# Patient Record
Sex: Female | Born: 1965 | Race: White | Hispanic: No | Smoking: Never smoker
Health system: Southern US, Community
[De-identification: ages and names within clinical notes are randomized; demographics above are authoritative.]

## PROBLEM LIST (undated history)

## (undated) DIAGNOSIS — K279 Peptic ulcer, site unspecified, unspecified as acute or chronic, without hemorrhage or perforation: Secondary | ICD-10-CM

## (undated) DIAGNOSIS — J309 Allergic rhinitis, unspecified: Secondary | ICD-10-CM

## (undated) HISTORY — PX: NO PAST SURGERIES: SHX2092

---

## 2003-10-31 ENCOUNTER — Other Ambulatory Visit: Admission: RE | Admit: 2003-10-31 | Discharge: 2003-10-31 | Payer: Self-pay | Admitting: *Deleted

## 2004-10-31 ENCOUNTER — Other Ambulatory Visit: Admission: RE | Admit: 2004-10-31 | Discharge: 2004-10-31 | Payer: Self-pay | Admitting: *Deleted

## 2006-01-06 ENCOUNTER — Other Ambulatory Visit: Admission: RE | Admit: 2006-01-06 | Discharge: 2006-01-06 | Payer: Self-pay | Admitting: *Deleted

## 2006-01-19 ENCOUNTER — Encounter: Admission: RE | Admit: 2006-01-19 | Discharge: 2006-01-19 | Payer: Self-pay | Admitting: Family Medicine

## 2007-01-12 ENCOUNTER — Other Ambulatory Visit: Admission: RE | Admit: 2007-01-12 | Discharge: 2007-01-12 | Payer: Self-pay | Admitting: *Deleted

## 2007-02-09 ENCOUNTER — Encounter: Admission: RE | Admit: 2007-02-09 | Discharge: 2007-02-09 | Payer: Self-pay | Admitting: Family Medicine

## 2008-02-25 ENCOUNTER — Encounter: Admission: RE | Admit: 2008-02-25 | Discharge: 2008-02-25 | Payer: Self-pay | Admitting: Family Medicine

## 2008-02-29 ENCOUNTER — Other Ambulatory Visit: Admission: RE | Admit: 2008-02-29 | Discharge: 2008-02-29 | Payer: Self-pay | Admitting: Family Medicine

## 2009-03-13 ENCOUNTER — Encounter: Admission: RE | Admit: 2009-03-13 | Discharge: 2009-03-13 | Payer: Self-pay | Admitting: Family Medicine

## 2009-03-29 ENCOUNTER — Other Ambulatory Visit: Admission: RE | Admit: 2009-03-29 | Discharge: 2009-03-29 | Payer: Self-pay | Admitting: Family Medicine

## 2016-11-24 ENCOUNTER — Ambulatory Visit (INDEPENDENT_AMBULATORY_CARE_PROVIDER_SITE_OTHER): Payer: No Typology Code available for payment source | Admitting: Family Medicine

## 2016-11-24 VITALS — BP 119/69 | HR 69 | Temp 98.0°F | Resp 16 | Ht <= 58 in | Wt 97.4 lb

## 2016-11-24 DIAGNOSIS — R0981 Nasal congestion: Secondary | ICD-10-CM | POA: Diagnosis not present

## 2016-11-24 DIAGNOSIS — J00 Acute nasopharyngitis [common cold]: Secondary | ICD-10-CM

## 2016-11-24 MED ORDER — FLUTICASONE PROPIONATE 50 MCG/ACT NA SUSP: 2.0000 | Freq: Every day | NASAL | 6 refills | Status: DC

## 2016-11-24 MED ORDER — CETIRIZINE HCL 10 MG PO TABS: 10.0000 mg | ORAL_TABLET | Freq: Every day | ORAL | 11 refills | Status: DC

## 2016-11-24 NOTE — Patient Instructions (Addendum)
We recommend that you schedule a mammogram for breast cancer screening. Typically, you do not need a referral to do this. Please contact a local imaging center to schedule your mammogram.  [facility] - [phone]  *ask for the Radiology Department [facility]) - [phone] or [phone]  [facility] - [phone] [facility] - [phone] [facility] - [phone]  *ask for the Radiology Department [facility] - [phone]  *ask for the Radiology Department [facility] - [phone]  *ask for the Mammography Department [facility] - [phone] We recommend that you schedule a mammogram for breast cancer screening. Typically, you do not need a referral to do this. Please contact a local imaging center to schedule your mammogram.      IF you received an x-ray today, you will receive an invoice from [facility]. Please contact [facility] at [phone] with questions or concerns regarding your invoice.   IF you received labwork today, you will receive an invoice from [facility]. Please contact [facility] at [phone] with questions or concerns regarding your invoice.   Our billing staff will not be able to assist you with questions regarding bills from these companies.  You will be contacted with the lab results as soon as they are available. The fastest way to get your results is to activate your My Chart account. Instructions are located on the last page of this paperwork. If you have not heard from [place] regarding the results in 2 weeks, please contact this office.     Allergic Rhinitis Allergic rhinitis is when the mucous membranes in the nose respond to allergens. Allergens are particles in the air that cause your body to have an allergic reaction. This causes you to release allergic antibodies. Through a chain of events, these eventually  cause you to release histamine into the blood stream. Although meant to protect the body, it is this release of histamine that causes your discomfort, such as frequent sneezing, congestion, and an itchy, runny nose. What are the causes? Seasonal allergic rhinitis (hay fever) is caused by pollen allergens that may come from grasses, trees, and weeds. Year-round allergic rhinitis (perennial allergic rhinitis) is caused by allergens such as house dust mites, pet dander, and mold spores. What are the signs or symptoms?  Nasal stuffiness (congestion).  Itchy, runny nose with sneezing and tearing of the eyes. How is this diagnosed? Your health care provider can help you determine the allergen or allergens that trigger your symptoms. If you and your health care provider are unable to determine the allergen, skin or blood testing may be used. Your health care provider will diagnose your condition after taking your health history and performing a physical exam. Your health care provider may assess you for other related conditions, such as asthma, pink eye, or an ear infection. How is this treated? Allergic rhinitis does not have a cure, but it can be controlled by:  Medicines that block allergy symptoms. These may include allergy shots, nasal sprays, and oral antihistamines.  Avoiding the allergen. Hay fever may often be treated with antihistamines in pill or nasal spray forms. Antihistamines block the effects of histamine. There are over-the-counter medicines that may help with nasal congestion and swelling around the eyes. Check with your health care provider before taking or giving this medicine. If avoiding the allergen or the medicine prescribed do not work, there are many new medicines your health care  provider can prescribe. Stronger medicine may be used if initial measures are ineffective. Desensitizing injections can be used if medicine and avoidance does not work. Desensitization is when a patient  is given ongoing shots until the body becomes less sensitive to the allergen. Make sure you follow up with your health care provider if problems continue. Follow these instructions at home: It is not possible to completely avoid allergens, but you can reduce your symptoms by taking steps to limit your exposure to them. It helps to know exactly what you are allergic to so that you can avoid your specific triggers. Contact a health care provider if:  You have a fever.  You develop a cough that does not stop easily (persistent).  You have shortness of breath.  You start wheezing.  Symptoms interfere with normal daily activities. This information is not intended to replace advice given to you by your health care provider. Make sure you discuss any questions you have with your health care provider. Document Released: 05/20/2001 Document Revised: 04/25/2016 Document Reviewed: 05/02/2013 Elsevier Interactive Patient Education  2017 [employer]

## 2016-11-24 NOTE — Progress Notes (Addendum)
  Chief Complaint  Patient presents with  . URI    sinus/pressure pain, lt ear pops, dry hacking cough few wks    HPI   Dry hacking cough with popping sensation in her ear She has been coughing for 2 weeks She initially had a more productive cough earlier in the episode She denies wheezing She reports that has sinus pressure She denies fevers or chills No sick contacts Non smoker No asthma history  She denies rashes, chest pains, urinary symptoms or diarrhea   No past medical history on file.  Current Outpatient Prescriptions  Medication Sig Dispense Refill  . cetirizine (ZYRTEC) 10 MG tablet Take 1 tablet (10 mg total) by mouth daily. 30 tablet 11  . fluticasone (FLONASE) 50 MCG/ACT nasal spray Place 2 sprays into both nostrils daily. 16 g 6   No current facility-administered medications for this visit.     Allergies: No Known Allergies  No past surgical history on file.  Social History   Social History  . Marital status: Married    Spouse name: N/A  . Number of children: N/A  . Years of education: N/A   Social History Main Topics  . Smoking status: Never Smoker  . Smokeless tobacco: Never Used  . Alcohol use 1.2 oz/week    2 Cans of beer per week  . Drug use: No  . Sexual activity: Not Asked   Other Topics Concern  . None   Social History Narrative  . None    ROS See hpi  Objective: Vitals:   11/24/16 0837  BP: 119/69  Pulse: 69  Resp: 16  Temp: 98 F (36.7 C)  TempSrc: Oral  SpO2: 97%  Weight: 97 lb 6.4 oz (44.2 kg)  Height: 4\' 10"  (1.473 m)    Physical Exam General: alert, oriented, in NAD Head: normocephalic, atraumatic, + frontal sinus tenderness Eyes: EOM intact, no scleral icterus or conjunctival injection Ears: TM clear bilaterally Nose: mucosa with erythema and edema Throat: no pharyngeal exudate or erythema Lymph: no posterior auricular, submental or cervical lymph adenopathy Heart: normal rate, normal sinus rhythm, no  murmurs Lungs: clear to auscultation bilaterally, no wheezing   Assessment and Plan Paula Ramos was seen today for uri.  Diagnoses and all orders for this visit:  Sinus congestion Nasal congestion Acute infective rhinitis  Discussed viral etiology Discussed otc decongestant Advised flonase for congestion  Increase hydration Vitamin C and zinc lozenges suggested Return to clinic if symptoms worse  -     fluticasone (FLONASE) 50 MCG/ACT nasal spray; Place 2 sprays into both nostrils daily. -     cetirizine (ZYRTEC) 10 MG tablet; Take 1 tablet (10 mg total) by mouth daily.     Paula Ramos A Creta LevinStallings'

## 2018-02-02 DIAGNOSIS — H109 Unspecified conjunctivitis: Secondary | ICD-10-CM | POA: Insufficient documentation

## 2018-02-02 DIAGNOSIS — J309 Allergic rhinitis, unspecified: Secondary | ICD-10-CM | POA: Insufficient documentation

## 2018-02-02 DIAGNOSIS — M542 Cervicalgia: Secondary | ICD-10-CM | POA: Insufficient documentation

## 2018-03-05 ENCOUNTER — Other Ambulatory Visit: Payer: Self-pay | Admitting: Family Medicine

## 2018-03-05 DIAGNOSIS — Z1231 Encounter for screening mammogram for malignant neoplasm of breast: Secondary | ICD-10-CM

## 2018-03-26 ENCOUNTER — Ambulatory Visit: Payer: Self-pay

## 2018-03-29 ENCOUNTER — Encounter: Payer: Self-pay | Admitting: Gastroenterology

## 2018-05-05 ENCOUNTER — Ambulatory Visit (AMBULATORY_SURGERY_CENTER): Payer: Self-pay | Admitting: *Deleted

## 2018-05-05 VITALS — Ht <= 58 in | Wt 95.0 lb

## 2018-05-05 DIAGNOSIS — Z1211 Encounter for screening for malignant neoplasm of colon: Secondary | ICD-10-CM

## 2018-05-05 NOTE — Progress Notes (Signed)
Patient denies any allergies to eggs or soy. Patient denies any problems with anesthesia/sedation. Patient denies any oxygen use at home. Patient denies taking any diet/weight loss medications or blood thinners. EMMI education assisgned to patient on colonoscopy, this was explained and instructions given to patient. 

## 2018-05-20 ENCOUNTER — Encounter: Payer: Self-pay | Admitting: Gastroenterology

## 2018-06-07 ENCOUNTER — Encounter (HOSPITAL_BASED_OUTPATIENT_CLINIC_OR_DEPARTMENT_OTHER): Payer: Self-pay | Admitting: *Deleted

## 2018-06-07 ENCOUNTER — Other Ambulatory Visit: Payer: Self-pay

## 2018-06-07 ENCOUNTER — Emergency Department (HOSPITAL_BASED_OUTPATIENT_CLINIC_OR_DEPARTMENT_OTHER)
Admission: EM | Admit: 2018-06-07 | Discharge: 2018-06-08 | Disposition: A | Discharge status: Home / Self Care | Payer: No Typology Code available for payment source | Attending: Emergency Medicine | Admitting: Emergency Medicine

## 2018-06-07 DIAGNOSIS — Z79899 Other long term (current) drug therapy: Secondary | ICD-10-CM | POA: Insufficient documentation

## 2018-06-07 DIAGNOSIS — R1013 Epigastric pain: Secondary | ICD-10-CM | POA: Insufficient documentation

## 2018-06-07 LAB — URINALYSIS, MICROSCOPIC (REFLEX): WBC, UA: >50 WBC/hpf (ref 0–5)

## 2018-06-07 LAB — COMPREHENSIVE METABOLIC PANEL
ALT: 14 U/L (ref 0–44)
AST: 19 U/L (ref 15–41)
Albumin: 4.5 g/dL (ref 3.5–5.0)
Alkaline Phosphatase: 65 U/L (ref 38–126)
Anion gap: 11 (ref 5–15)
BUN: 32 mg/dL — ABNORMAL HIGH (ref 6–20)
CO2: 26 mmol/L (ref 22–32)
Calcium: 9.3 mg/dL (ref 8.9–10.3)
Chloride: 102 mmol/L (ref 98–111)
Creatinine, Ser: 0.95 mg/dL (ref 0.44–1.00)
GFR calc Af Amer: >60 mL/min (ref >60)
GFR calc non Af Amer: >60 mL/min (ref >60)
Glucose, Bld: 107 mg/dL — ABNORMAL HIGH (ref 70–99)
Potassium: 3.7 mmol/L (ref 3.5–5.1)
Sodium: 139 mmol/L (ref 135–145)
Total Bilirubin: 1 mg/dL (ref 0.3–1.2)
Total Protein: 8 g/dL (ref 6.5–8.1)

## 2018-06-07 LAB — CBC
HCT: 34.1 % — ABNORMAL LOW (ref 36.0–46.0)
Hemoglobin: 11.4 g/dL — ABNORMAL LOW (ref 12.0–15.0)
MCH: 32.7 pg (ref 26.0–34.0)
MCHC: 33.4 g/dL (ref 30.0–36.0)
MCV: 97.7 fL (ref 78.0–100.0)
Platelets: 247 10*3/uL (ref 150–400)
RBC: 3.49 MIL/uL — ABNORMAL LOW (ref 3.87–5.11)
RDW: 11.7 % (ref 11.5–15.5)
WBC: 12.8 10*3/uL — ABNORMAL HIGH (ref 4.0–10.5)

## 2018-06-07 LAB — URINALYSIS, ROUTINE W REFLEX MICROSCOPIC
Bilirubin Urine: NEGATIVE
Glucose, UA: NEGATIVE mg/dL
Ketones, ur: 15 mg/dL — AB
Nitrite: NEGATIVE
Protein, ur: NEGATIVE mg/dL
Specific Gravity, Urine: 1.01 (ref 1.005–1.030)
pH: 6.5 (ref 5.0–8.0)

## 2018-06-07 LAB — LIPASE, BLOOD: Lipase: 40 U/L (ref 11–51)

## 2018-06-07 MED ORDER — SODIUM CHLORIDE 0.9 % IV BOLUS
1000.0000 mL | Freq: Once | INTRAVENOUS | Status: AC
  Administered 2018-06-07: 1000 mL via INTRAVENOUS

## 2018-06-07 MED ORDER — GI COCKTAIL ~~LOC~~
30.0000 mL | Freq: Once | ORAL | Status: AC
Start: 2018-06-07 — End: 2018-06-07
  Administered 2018-06-07: 30 mL via ORAL
  Filled 2018-06-07: qty 30

## 2018-06-07 MED ORDER — FAMOTIDINE 20 MG PO TABS
20.0000 mg | ORAL_TABLET | Freq: Once | ORAL | Status: AC
  Administered 2018-06-07: 20 mg via ORAL
  Filled 2018-06-07: qty 1

## 2018-06-07 MED ORDER — PANTOPRAZOLE SODIUM 20 MG PO TBEC: 20.0000 mg | DELAYED_RELEASE_TABLET | Freq: Two times a day (BID) | ORAL | 0 refills | Status: DC

## 2018-06-07 NOTE — ED Provider Notes (Signed)
[facility] EMERGENCY DEPARTMENT Provider Note   CSN: [id number] Arrival date & time: 06/07/18  1902     History   Chief Complaint Chief Complaint  Patient presents with  . Abdominal Pain    HPI Paula Ramos is a 52 y.o. female.  HPI    52 year old female with upper abdominal pain.  She has had nausea.  Onset 3 days ago.  The vomiting.  No diarrhea.  No urinary complaints.  Symptoms fairly constant.  No appreciable exacerbating relieving factors. History reviewed. No pertinent past medical history.  Patient Active Problem List   Diagnosis Date Noted  . Allergic rhinitis 02/02/2018  . Conjunctivitis 02/02/2018  . Neck pain 02/02/2018    Past Surgical History:  Procedure Laterality Date  . NO PAST SURGERIES       OB History   None      Home Medications    Prior to Admission medications   Medication Sig Start Date End Date Taking? Authorizing Provider  bisacodyl (BISACODYL) 5 MG EC tablet Take 5 mg by mouth once.    [provider]  cetirizine (ZYRTEC) 10 MG tablet Take 1 tablet (10 mg total) by mouth daily. 11/24/16   Doristine Bosworth, MD  fluticasone (FLONASE) 50 MCG/ACT nasal spray Place 2 sprays into both nostrils daily. 11/24/16   Doristine Bosworth, MD  ibuprofen (ADVIL,MOTRIN) 200 MG tablet Take 2 tablets by mouth as needed.    [provider]  Multiple Vitamin (MULTI-VITAMIN DAILY PO) Take 1 tablet by mouth daily.    [provider]    Family History Family History  Problem Relation Age of Onset  . Kidney disease Mother   . Colon cancer Neg Hx   . Esophageal cancer Neg Hx   . Rectal cancer Neg Hx   . Stomach cancer Neg Hx     Social History Social History   Tobacco Use  . Smoking status: Never Smoker  . Smokeless tobacco: Never Used  Substance Use Topics  . Alcohol use: Yes    Alcohol/week: 2.0 standard drinks    Types: 2 Cans of beer per week  . Drug use: No     Allergies   Patient has no known  allergies.   Review of Systems Review of Systems  All systems reviewed and negative, other than as noted in HPI.  Physical Exam Updated Vital Signs BP 133/67   Pulse 85   Temp 99.3 F (37.4 C)   Resp (!) 24   Ht 4\' 10"  (1.473 m)   Wt 42.6 kg   SpO2 98%   BMI 19.65 kg/m   Physical Exam  Constitutional: She appears well-developed and well-nourished. No distress.  HENT:  Head: Normocephalic and atraumatic.  Eyes: Conjunctivae are normal. Right eye exhibits no discharge. Left eye exhibits no discharge.  Neck: Neck supple.  Cardiovascular: Normal rate, regular rhythm and normal heart sounds. Exam reveals no gallop and no friction rub.  No murmur heard. Pulmonary/Chest: Effort normal and breath sounds normal. No respiratory distress.  Abdominal: Soft. She exhibits no distension. There is tenderness in the epigastric area. There is no rigidity, no rebound and no guarding.  Musculoskeletal: She exhibits no edema or tenderness.  Neurological: She is alert.  Skin: Skin is warm and dry.  Psychiatric: She has a normal mood and affect. Her behavior is normal. Thought content normal.  Nursing note and vitals reviewed.    ED Treatments / Results  Labs (all labs ordered are listed, but  only abnormal results are displayed) Labs Reviewed  URINALYSIS, ROUTINE W REFLEX MICROSCOPIC - Abnormal; Notable for the following components:      Result Value   Hgb urine dipstick TRACE (*)    Ketones, ur 15 (*)    Leukocytes, UA LARGE (*)    All other components within normal limits  URINALYSIS, MICROSCOPIC (REFLEX) - Abnormal; Notable for the following components:   Bacteria, UA FEW (*)    All other components within normal limits  LIPASE, BLOOD  COMPREHENSIVE METABOLIC PANEL  CBC    EKG None  Radiology No results found.  Procedures Procedures (including critical care time)  Medications Ordered in ED Medications - No data to display   Initial Impression / Assessment and Plan /  ED Course  I have reviewed the triage vital signs and the nursing notes.  Pertinent labs & imaging results that were available during my care of the patient were reviewed by me and considered in my medical decision making (see chart for details).    52 year old female with upper abdominal pain.  Suspect possible PUD or gastritis.  ED work-up fairly unremarkable.  Treated symptomatically with much improvement.  I doubt acute surgical emergency.  Return precautions discussed.  Final Clinical Impressions(s) / ED Diagnoses   Final diagnoses:  Epigastric pain    ED Discharge Orders         Ordered    pantoprazole (PROTONIX) 20 MG tablet  2 times daily before meals     06/07/18 2313           Raeford Razor, MD 06/16/18 1552

## 2018-06-07 NOTE — ED Notes (Signed)
Pt was seen by [facility] today and referred to GI for recurrent abd pain.

## 2018-06-07 NOTE — ED Triage Notes (Signed)
Pt c/o abd pain x 3 days denies v/d

## 2018-06-07 NOTE — ED Notes (Signed)
ED Provider at bedside. 

## 2018-06-07 NOTE — ED Notes (Signed)
Pt with intermittent abd pain and nausea for 3 days. Denies diarrhea.

## 2018-06-09 LAB — URINE CULTURE

## 2018-07-06 ENCOUNTER — Ambulatory Visit (AMBULATORY_SURGERY_CENTER): Payer: Self-pay | Admitting: *Deleted

## 2018-07-06 ENCOUNTER — Encounter: Payer: Self-pay | Admitting: Gastroenterology

## 2018-07-06 VITALS — Ht <= 58 in | Wt 95.0 lb

## 2018-07-06 DIAGNOSIS — Z1211 Encounter for screening for malignant neoplasm of colon: Secondary | ICD-10-CM

## 2018-07-06 NOTE — Progress Notes (Signed)
Patient denies any allergies to eggs or soy. Patient denies any problems with anesthesia/sedation. Patient denies any oxygen use at home. Patient denies taking any diet/weight loss medications or blood thinners. EMMI education already watched by the pt.

## 2018-07-20 ENCOUNTER — Ambulatory Visit (AMBULATORY_SURGERY_CENTER): Payer: No Typology Code available for payment source | Admitting: Gastroenterology

## 2018-07-20 ENCOUNTER — Encounter: Payer: Self-pay | Admitting: Gastroenterology

## 2018-07-20 VITALS — BP 153/62 | HR 69 | Temp 98.0°F | Resp 17 | Ht <= 58 in | Wt 97.0 lb

## 2018-07-20 DIAGNOSIS — Z1211 Encounter for screening for malignant neoplasm of colon: Secondary | ICD-10-CM | POA: Diagnosis not present

## 2018-07-20 MED ORDER — SODIUM CHLORIDE 0.9 % IV SOLN: 500.0000 mL | Freq: Once | INTRAVENOUS | Status: DC

## 2018-07-20 NOTE — Patient Instructions (Signed)
*  handout on hemorrhoids given*  YOU HAD AN ENDOSCOPIC PROCEDURE TODAY AT THE [facility]:   Refer to the procedure report that was given to you for any specific questions about what was found during the examination.  If the procedure report does not answer your questions, please call your gastroenterologist to clarify.  If you requested that your care partner not be given the details of your procedure findings, then the procedure report has been included in a sealed envelope for you to review at your convenience later.  YOU SHOULD EXPECT: Some feelings of bloating in the abdomen. Passage of more gas than usual.  Walking can help get rid of the air that was put into your GI tract during the procedure and reduce the bloating. If you had a lower endoscopy (such as a colonoscopy or flexible sigmoidoscopy) you may notice spotting of blood in your stool or on the toilet paper. If you underwent a bowel prep for your procedure, you may not have a normal bowel movement for a few days.  Please Note:  You might notice some irritation and congestion in your nose or some drainage.  This is from the oxygen used during your procedure.  There is no need for concern and it should clear up in a day or so.  SYMPTOMS TO REPORT IMMEDIATELY:   Following lower endoscopy (colonoscopy or flexible sigmoidoscopy):  Excessive amounts of blood in the stool  Significant tenderness or worsening of abdominal pains  Swelling of the abdomen that is new, acute  Fever of 100F or higher   For urgent or emergent issues, a gastroenterologist can be reached at any hour by calling [phone].   DIET:  We do recommend a small meal at first, but then you may proceed to your regular diet.  Drink plenty of fluids but you should avoid alcoholic beverages for 24 hours.  ACTIVITY:  You should plan to take it easy for the rest of today and you should NOT DRIVE or use heavy machinery until tomorrow (because of the sedation  medicines used during the test).    FOLLOW UP: Our staff will call the number listed on your records the next business day following your procedure to check on you and address any questions or concerns that you may have regarding the information given to you following your procedure. If we do not reach you, we will leave a message.  However, if you are feeling well and you are not experiencing any problems, there is no need to return our call.  We will assume that you have returned to your regular daily activities without incident.  If any biopsies were taken you will be contacted by phone or by letter within the next 1-3 weeks.  Please call us at [phone] if you have not heard about the biopsies in 3 weeks.    SIGNATURES/CONFIDENTIALITY: You and/or your care partner have signed paperwork which will be entered into your electronic medical record.  These signatures attest to the fact that that the information above on your After Visit Summary has been reviewed and is understood.  Full responsibility of the confidentiality of this discharge information lies with you and/or your care-partner. 

## 2018-07-20 NOTE — Progress Notes (Signed)
Report to RN, VSS, adequate respirations noted, no c/o pain or discomfort 

## 2018-07-20 NOTE — Op Note (Signed)
[facility] Patient Name: Paula Ramos Procedure Date: 07/20/2018 9:25 AM MRN: [id number] Endoscopist: Justice Britain , MD Age: 52 Referring MD:  Date of Birth: May 08, 1966 Gender: Female Account #: [id number] Procedure:                Colonoscopy Indications:              Screening for colorectal malignant neoplasm Medicines:                Monitored Anesthesia Care Procedure:                Pre-Anesthesia Assessment:                           - Prior to the procedure, a History and Physical                            was performed, and patient medications and                            allergies were reviewed. The patient's tolerance of                            previous anesthesia was also reviewed. The risks                            and benefits of the procedure and the sedation                            options and risks were discussed with the patient.                            All questions were answered, and informed consent                            was obtained. Prior Anticoagulants: The patient has                            taken no previous anticoagulant or antiplatelet                            agents. ASA Grade Assessment: II - A patient with                            mild systemic disease. After reviewing the risks                            and benefits, the patient was deemed in                            satisfactory condition to undergo the procedure.                           After obtaining informed consent, the colonoscope  was passed under direct vision. Throughout the                            procedure, the patient's blood pressure, pulse, and                            oxygen saturations were monitored continuously. The                            Model PCF-[id number]) scope was introduced                            through the anus and advanced to the 5 cm into the                            ileum. The  colonoscopy was performed without                            difficulty. The patient tolerated the procedure.                            The quality of the bowel preparation was evaluated                            using the BBPS [facility] Bowel Preparation Scale)                            with scores of: Right Colon = 3, Transverse Colon =                            3 and Left Colon = 3 (entire mucosa seen well with                            no residual staining, small fragments of stool or                            opaque liquid). The total BBPS score equals 9. Scope In: 9:28:09 AM Scope Out: 9:41:40 AM Scope Withdrawal Time: 0 hours 7 minutes 14 seconds  Total Procedure Duration: 0 hours 13 minutes 31 seconds  Findings:                 Skin tags were found on perianal exam.                           The digital rectal exam findings include                            non-thrombosed internal hemorrhoids. Pertinent                            negatives include no palpable rectal lesions.  The colon (entire examined portion) was moderately                            tortuous. Advancing the scope required changing the                            patient's position, using manual pressure,                            straightening and shortening the scope to obtain                            bowel loop reduction and using scope torsion.                           The terminal ileum and ileocecal valve appeared                            normal.                           Normal mucosa was found in the entire colon.                           Anal papilla(e) were hypertrophied.                           Non-bleeding non-thrombosed internal hemorrhoids                            were found during retroflexion, during perianal                            exam and during digital exam. The hemorrhoids were                            Grade II (internal hemorrhoids that prolapse  but                            reduce spontaneously). Complications:            No immediate complications. Estimated Blood Loss:     Estimated blood loss: none. Impression:               - Perianal skin tags found on perianal exam.                           - Non-thrombosed internal hemorrhoids found on                            digital rectal exam.                           - Tortuous colon.                           - The examined portion of the  ileum was normal.                           - Normal mucosa in the entire examined colon.                           - Anal papilla(e) were hypertrophied.                           - Non-bleeding non-thrombosed internal hemorrhoids. Recommendation:           - The patient will be observed post-procedure,                            until all discharge criteria are met.                           - Discharge patient to home.                           - Patient has a contact number available for                            emergencies. The signs and symptoms of potential                            delayed complications were discussed with the                            patient. Return to normal activities tomorrow.                            Written discharge instructions were provided to the                            patient.                           - Resume previous diet.                           - Continue present medications.                           - Consider addition of fiber supplementation 1-2                            times daily (Fibercon/Metamucil/Citrucel/Benefiber).                           - Would consider Miralax use once daily to help if                            issues of constipation occur.                           - Repeat colonoscopy in 10 years for  screening                            purposes.                           - The findings and recommendations were discussed                            with the patient.                            - The findings and recommendations were discussed                            with the designated responsible adult. Justice Britain, MD 07/20/2018 9:49:08 AM

## 2018-07-20 NOTE — Progress Notes (Signed)
Pt's states no medical or surgical changes since previsit or office visit. 

## 2018-07-21 ENCOUNTER — Telehealth: Payer: Self-pay

## 2018-07-21 NOTE — Telephone Encounter (Signed)
  Follow up Call-  Call back number 07/20/2018  Post procedure Call Back phone  # [phone] cell  Permission to leave phone message Yes  Some recent data might be hidden     Patient questions:  Do you have a fever, pain , or abdominal swelling? No. Pain Score  0 *  Have you tolerated food without any problems? Yes.    Have you been able to return to your normal activities? Yes.    Do you have any questions about your discharge instructions: Diet   No. Medications  No. Follow up visit  No.  Do you have questions or concerns about your Care? No.  Actions: * If pain score is 4 or above: No action needed, pain <4.

## 2021-04-22 ENCOUNTER — Encounter: Payer: Self-pay | Admitting: Emergency Medicine

## 2021-04-22 ENCOUNTER — Ambulatory Visit
Admission: EM | Admit: 2021-04-22 | Discharge: 2021-04-22 | Disposition: A | Discharge status: Home / Self Care | Payer: 59 | Attending: Emergency Medicine | Admitting: Emergency Medicine

## 2021-04-22 ENCOUNTER — Other Ambulatory Visit: Payer: Self-pay

## 2021-04-22 DIAGNOSIS — Z1152 Encounter for screening for COVID-19: Secondary | ICD-10-CM

## 2021-04-22 DIAGNOSIS — R5383 Other fatigue: Secondary | ICD-10-CM | POA: Diagnosis not present

## 2021-04-22 DIAGNOSIS — R0981 Nasal congestion: Secondary | ICD-10-CM | POA: Diagnosis not present

## 2021-04-22 DIAGNOSIS — R109 Unspecified abdominal pain: Secondary | ICD-10-CM

## 2021-04-22 LAB — POCT URINALYSIS DIP (MANUAL ENTRY)
Bilirubin, UA: NEGATIVE
Blood, UA: NEGATIVE
Glucose, UA: NEGATIVE mg/dL
Ketones, POC UA: NEGATIVE mg/dL
Leukocytes, UA: NEGATIVE
Nitrite, UA: NEGATIVE
Spec Grav, UA: 1.02 (ref 1.010–1.025)
Urobilinogen, UA: 0.2 E.U./dL
pH, UA: 7 (ref 5.0–8.0)

## 2021-04-22 MED ORDER — TRIAMCINOLONE ACETONIDE 55 MCG/ACT NA AERO: 2.0000 | INHALATION_SPRAY | Freq: Every day | NASAL | 12 refills | Status: DC

## 2021-04-22 MED ORDER — PREDNISONE 20 MG PO TABS: 40.0000 mg | ORAL_TABLET | Freq: Every day | ORAL | 0 refills | Status: AC

## 2021-04-22 MED ORDER — DOXYCYCLINE HYCLATE 100 MG PO CAPS
100.0000 mg | ORAL_CAPSULE | Freq: Two times a day (BID) | ORAL | 0 refills | Status: AC
Start: 2021-04-22 — End: 2021-04-29

## 2021-04-22 MED ORDER — TIZANIDINE HCL 2 MG PO TABS: 2.0000 mg | ORAL_TABLET | Freq: Four times a day (QID) | ORAL | 0 refills | Status: DC | PRN

## 2021-04-22 NOTE — Discharge Instructions (Addendum)
Blood work and Occupational psychologist daily Nasacort nasal spray Begin doxycycline twice daily for 1 week to treat possible sinus infection Prednisone 40 mg daily x5 days-take with food and earlier in the day if possible-this is meant to help with sinus inflammation/congestion along with any inflammation within muscles of back Supplement with tizanidine at home or bedtime-this is a muscle relaxer, do not drive or work after taking Follow-up with primary care

## 2021-04-22 NOTE — ED Triage Notes (Addendum)
Patient c/o LFT sided flank pain x 4 months.   Patient denies fever at home.   Patient states " this has been going on for a while but would go away and then come back".  Patient denies dysuria.   Patient endorses "dark colored" urine.   Patient states " I haven't used the restroom in 24 hours, like potty" as in bowel movement.   Patient has increased fluid intake.    Patient c/o nasal congestion x "months".   Patient states symptoms "are off and on".   Patient endorses headache at times.   Patient has used Advil w/ no relief of symptoms.

## 2021-04-22 NOTE — ED Provider Notes (Signed)
[facility]    CSN: [id number] Arrival date & time: 04/22/21  1108      History   Chief Complaint Chief Complaint  Patient presents with   Flank Pain   Nasal Congestion    HPI Paula Ramos is a 55 y.o. female presenting today for evaluation of left flank pain and nasal congestion.  Reports that she has had left hip pain for approximately 4 months.  Denies any known fevers at home.  Reports symptoms are intermittent.  Denies associated dysuria, but has had dark urine.  Describes an intermittent pain sensation as well as itching going up into back and extremities.  Does express concern regarding her kidneys as she reports her mom was on dialysis.  Associated fatigue x months.   Also reports nasal congestion times months.  Symptoms are also intermittent.  Using ibuprofen without relief.  Reports previous course of Augmentin without relief of symptoms.  Also has tried Flonase and Zyrtec without relief.  She reports persistent postnasal drainage.  Mild headache and feeling warm today, but no known fevers.  No known COVID exposure  HPI  History reviewed. No pertinent past medical history.  Patient Active Problem List   Diagnosis Date Noted   Allergic rhinitis 02/02/2018   Conjunctivitis 02/02/2018   Neck pain 02/02/2018    Past Surgical History:  Procedure Laterality Date   NO PAST SURGERIES      OB History   No obstetric history on file.      Home Medications    Prior to Admission medications   Medication Sig Start Date End Date Taking? Authorizing Provider  doxycycline (VIBRAMYCIN) 100 MG capsule Take 1 capsule (100 mg total) by mouth 2 (two) times daily for 7 days. 04/22/21 04/29/21 Yes Gunner Iodice C, PA-C  ibuprofen (ADVIL,MOTRIN) 200 MG tablet Take 2 tablets by mouth as needed.   Yes [provider]  Multiple Vitamin (MULTI-VITAMIN DAILY PO) Take 1 tablet by mouth daily.   Yes [provider]  predniSONE (DELTASONE) 20 MG tablet Take 2  tablets (40 mg total) by mouth daily with breakfast for 5 days. 04/22/21 04/27/21 Yes Fong Mccarry C, PA-C  tiZANidine (ZANAFLEX) 2 MG tablet Take 1-2 tablets (2-4 mg total) by mouth every 6 (six) hours as needed for muscle spasms. 04/22/21  Yes Jodi Kappes C, PA-C  triamcinolone (NASACORT) 55 MCG/ACT AERO nasal inhaler Place 2 sprays into the nose daily. 04/22/21  Yes Akylah Hascall C, PA-C  bisacodyl (DULCOLAX) 5 MG EC tablet Take 5 mg by mouth once.    [provider]  cetirizine (ZYRTEC) 10 MG tablet Take 1 tablet (10 mg total) by mouth daily. Patient not taking: No sig reported 11/24/16   Doristine Bosworth, MD    Family History Family History  Problem Relation Age of Onset   Kidney disease Mother    Colon cancer Neg Hx    Esophageal cancer Neg Hx    Rectal cancer Neg Hx    Stomach cancer Neg Hx     Social History Social History   Tobacco Use   Smoking status: Never   Smokeless tobacco: Never  Vaping Use   Vaping Use: Never used  Substance Use Topics   Alcohol use: Yes    Alcohol/week: 2.0 standard drinks    Types: 2 Cans of beer per week   Drug use: No     Allergies   Patient has no known allergies.   Review of Systems Review of Systems  Constitutional:  Positive for fatigue. Negative for activity change, appetite change, chills and fever.  HENT:  Positive for congestion and rhinorrhea. Negative for ear pain, sinus pressure, sore throat and trouble swallowing.   Eyes:  Negative for discharge and redness.  Respiratory:  Negative for cough, chest tightness and shortness of breath.   Cardiovascular:  Negative for chest pain.  Gastrointestinal:  Negative for abdominal pain, diarrhea, nausea and vomiting.  Genitourinary:  Positive for flank pain. Negative for dysuria, genital sores, hematuria, menstrual problem, vaginal bleeding, vaginal discharge and vaginal pain.  Musculoskeletal:  Negative for back pain and myalgias.  Skin:  Negative for rash.   Neurological:  Positive for headaches. Negative for dizziness and light-headedness.    Physical Exam Triage Vital Signs ED Triage Vitals  Enc Vitals Group     BP 04/22/21 1119 133/77     Pulse Rate 04/22/21 1119 75     Resp 04/22/21 1119 17     Temp 04/22/21 1119 100.1 F (37.8 C)     Temp Source 04/22/21 1119 Oral     SpO2 04/22/21 1119 98 %     Weight --      Height --      Head Circumference --      Peak Flow --      Pain Score 04/22/21 1117 8     Pain Loc --      Pain Edu? --      Excl. in GC? --    No data found.  Updated Vital Signs BP 133/77 (BP Location: Left Arm)   Pulse 75   Temp 100.1 F (37.8 C) (Oral)   Resp 17   SpO2 98%   Visual Acuity Right Eye Distance:   Left Eye Distance:   Bilateral Distance:    Right Eye Near:   Left Eye Near:    Bilateral Near:     Physical Exam Vitals and nursing note reviewed.  Constitutional:      Appearance: She is well-developed.     Comments: No acute distress  HENT:     Head: Normocephalic and atraumatic.     Ears:     Comments: Bilateral ears without tenderness to palpation of external auricle, tragus and mastoid, EAC's without erythema or swelling, TM's with good bony landmarks and cone of light. Non erythematous.      Nose: Nose normal.     Mouth/Throat:     Comments: Oral mucosa pink and moist, no tonsillar enlargement or exudate. Posterior pharynx patent and nonerythematous, no uvula deviation or swelling. Normal phonation.  Eyes:     Conjunctiva/sclera: Conjunctivae normal.  Cardiovascular:     Rate and Rhythm: Normal rate.  Pulmonary:     Effort: Pulmonary effort is normal. No respiratory distress.     Comments: Breathing comfortably at rest, CTABL, no wheezing, rales or other adventitious sounds auscultated  Abdominal:     General: There is no distension.     Comments: Soft, nondistended, nontender to light and deep Palpation throughout abdomen  Musculoskeletal:        General: Normal range of  motion.     Cervical back: Neck supple.     Comments: Back: Nontender to palpation of cervical, thoracic and lumbar spine midline, mild tenderness to palpation to far lateral lower thoracic musculature, full active range of motion of upper and lower extremities  Skin:    General: Skin is warm and dry.  Neurological:     Mental Status: She is alert and oriented to  person, place, and time.     UC Treatments / Results  Labs (all labs ordered are listed, but only abnormal results are displayed) Labs Reviewed  POCT URINALYSIS DIP (MANUAL ENTRY) - Abnormal; Notable for the following components:      Result Value   Clarity, UA hazy (*)    Protein Ur, POC trace (*)    All other components within normal limits  NOVEL CORONAVIRUS, NAA  CBC WITH DIFFERENTIAL/PLATELET  COMPREHENSIVE METABOLIC PANEL    EKG   Radiology No results found.  Procedures Procedures (including critical care time)  Medications Ordered in UC Medications - No data to display  Initial Impression / Assessment and Plan / UC Course  I have reviewed the triage vital signs and the nursing notes.  Pertinent labs & imaging results that were available during my care of the patient were reviewed by me and considered in my medical decision making (see chart for details).     Nasal congestion times months-treating for possible sinusitis with doxycycline, prednisone course and Nasacort as alternative treatment Fatigue/flank pain-checking blood work to check basic labs, kidney function, monitor for improvement with prednisone use  Establish care with PCP for flareup of follow-up of fatigue if symptoms persistent  Discussed strict return precautions. Patient verbalized understanding and is agreeable with plan.  Final Clinical Impressions(s) / UC Diagnoses   Final diagnoses:  Fatigue, unspecified type  Left flank pain  Encounter for screening for COVID-19  Nasal congestion     Discharge Instructions      Blood  work and COVID test pending Begin daily Nasacort nasal spray Begin doxycycline twice daily for 1 week to treat possible sinus infection Prednisone 40 mg daily x5 days-take with food and earlier in the day if possible-this is meant to help with sinus inflammation/congestion along with any inflammation within muscles of back Supplement with tizanidine at home or bedtime-this is a muscle relaxer, do not drive or work after taking Follow-up with primary care     ED Prescriptions     Medication Sig Dispense Auth. Provider   doxycycline (VIBRAMYCIN) 100 MG capsule Take 1 capsule (100 mg total) by mouth 2 (two) times daily for 7 days. 14 capsule Mishell Donalson C, PA-C   predniSONE (DELTASONE) 20 MG tablet Take 2 tablets (40 mg total) by mouth daily with breakfast for 5 days. 10 tablet Waylynn Benefiel C, PA-C   tiZANidine (ZANAFLEX) 2 MG tablet Take 1-2 tablets (2-4 mg total) by mouth every 6 (six) hours as needed for muscle spasms. 30 tablet Paz Winsett C, PA-C   triamcinolone (NASACORT) 55 MCG/ACT AERO nasal inhaler Place 2 sprays into the nose daily. 1 each Peony Barner, Junius Creamer, PA-C      PDMP not reviewed this encounter.   Lew Dawes, PA-C 04/22/21 1304

## 2021-04-23 LAB — CBC WITH DIFFERENTIAL/PLATELET
Basophils Absolute: 0.1 10*3/uL (ref 0.0–0.2)
Basos: 1 %
EOS (ABSOLUTE): 0 10*3/uL (ref 0.0–0.4)
Eos: 0 %
Hematocrit: 34 % (ref 34.0–46.6)
Hemoglobin: 11.4 g/dL (ref 11.1–15.9)
Immature Grans (Abs): 0 10*3/uL (ref 0.0–0.1)
Immature Granulocytes: 0 %
Lymphocytes Absolute: 2.1 10*3/uL (ref 0.7–3.1)
Lymphs: 21 %
MCH: 33.2 pg — ABNORMAL HIGH (ref 26.6–33.0)
MCHC: 33.5 g/dL (ref 31.5–35.7)
MCV: 99 fL — ABNORMAL HIGH (ref 79–97)
Monocytes Absolute: 0.6 10*3/uL (ref 0.1–0.9)
Monocytes: 6 %
Neutrophils Absolute: 7.5 10*3/uL — ABNORMAL HIGH (ref 1.4–7.0)
Neutrophils: 72 %
Platelets: 231 10*3/uL (ref 150–450)
RBC: 3.43 x10E6/uL — ABNORMAL LOW (ref 3.77–5.28)
RDW: 11.2 % — ABNORMAL LOW (ref 11.7–15.4)
WBC: 10.4 10*3/uL (ref 3.4–10.8)

## 2021-04-23 LAB — NOVEL CORONAVIRUS, NAA: SARS-CoV-2, NAA: NOT DETECTED

## 2021-04-23 LAB — COMPREHENSIVE METABOLIC PANEL
ALT: 14 IU/L (ref 0–32)
AST: 22 IU/L (ref 0–40)
Albumin/Globulin Ratio: 2.3 — ABNORMAL HIGH (ref 1.2–2.2)
Albumin: 5.1 g/dL — ABNORMAL HIGH (ref 3.8–4.9)
Alkaline Phosphatase: 80 IU/L (ref 44–121)
BUN/Creatinine Ratio: 18 (ref 9–23)
BUN: 15 mg/dL (ref 6–24)
Bilirubin Total: 0.3 mg/dL (ref 0.0–1.2)
CO2: 27 mmol/L (ref 20–29)
Calcium: 10.1 mg/dL (ref 8.7–10.2)
Chloride: 104 mmol/L (ref 96–106)
Creatinine, Ser: 0.83 mg/dL (ref 0.57–1.00)
Globulin, Total: 2.2 g/dL (ref 1.5–4.5)
Glucose: 99 mg/dL (ref 65–99)
Potassium: 4.1 mmol/L (ref 3.5–5.2)
Sodium: 145 mmol/L — ABNORMAL HIGH (ref 134–144)
Total Protein: 7.3 g/dL (ref 6.0–8.5)
eGFR: 82 mL/min/{1.73_m2} (ref >59)

## 2021-04-23 LAB — SARS-COV-2, NAA 2 DAY TAT

## 2021-11-25 ENCOUNTER — Other Ambulatory Visit: Payer: Self-pay | Admitting: Family Medicine

## 2021-11-25 DIAGNOSIS — Z1231 Encounter for screening mammogram for malignant neoplasm of breast: Secondary | ICD-10-CM

## 2021-11-29 ENCOUNTER — Ambulatory Visit
Admission: RE | Admit: 2021-11-29 | Discharge: 2021-11-29 | Disposition: A | Discharge status: Home / Self Care | Payer: 59 | Source: Ambulatory Visit | Attending: Family Medicine | Admitting: Family Medicine

## 2021-11-29 ENCOUNTER — Other Ambulatory Visit: Payer: Self-pay

## 2021-11-29 DIAGNOSIS — Z1231 Encounter for screening mammogram for malignant neoplasm of breast: Secondary | ICD-10-CM

## 2021-12-19 ENCOUNTER — Other Ambulatory Visit: Payer: Self-pay | Admitting: Family Medicine

## 2021-12-19 DIAGNOSIS — R3121 Asymptomatic microscopic hematuria: Secondary | ICD-10-CM

## 2021-12-19 DIAGNOSIS — Z841 Family history of disorders of kidney and ureter: Secondary | ICD-10-CM

## 2021-12-20 ENCOUNTER — Ambulatory Visit
Admission: RE | Admit: 2021-12-20 | Discharge: 2021-12-20 | Disposition: A | Discharge status: Home / Self Care | Payer: 59 | Source: Ambulatory Visit | Attending: Family Medicine | Admitting: Family Medicine

## 2021-12-20 DIAGNOSIS — Z841 Family history of disorders of kidney and ureter: Secondary | ICD-10-CM

## 2021-12-20 DIAGNOSIS — R3121 Asymptomatic microscopic hematuria: Secondary | ICD-10-CM

## 2022-02-09 ENCOUNTER — Encounter (HOSPITAL_COMMUNITY): Payer: Self-pay

## 2022-02-09 ENCOUNTER — Observation Stay (HOSPITAL_COMMUNITY)
Admission: EM | Admit: 2022-02-09 | Discharge: 2022-02-12 | Disposition: A | Discharge status: Home / Self Care | Payer: 59 | Attending: Internal Medicine | Admitting: Internal Medicine

## 2022-02-09 ENCOUNTER — Emergency Department (HOSPITAL_COMMUNITY): Payer: 59

## 2022-02-09 ENCOUNTER — Other Ambulatory Visit: Payer: Self-pay

## 2022-02-09 DIAGNOSIS — K219 Gastro-esophageal reflux disease without esophagitis: Secondary | ICD-10-CM | POA: Diagnosis present

## 2022-02-09 DIAGNOSIS — K279 Peptic ulcer, site unspecified, unspecified as acute or chronic, without hemorrhage or perforation: Secondary | ICD-10-CM

## 2022-02-09 DIAGNOSIS — Z79899 Other long term (current) drug therapy: Secondary | ICD-10-CM | POA: Diagnosis not present

## 2022-02-09 DIAGNOSIS — K2289 Other specified disease of esophagus: Secondary | ICD-10-CM | POA: Diagnosis not present

## 2022-02-09 DIAGNOSIS — D508 Other iron deficiency anemias: Secondary | ICD-10-CM | POA: Diagnosis not present

## 2022-02-09 DIAGNOSIS — N19 Unspecified kidney failure: Secondary | ICD-10-CM | POA: Diagnosis not present

## 2022-02-09 DIAGNOSIS — R7989 Other specified abnormal findings of blood chemistry: Secondary | ICD-10-CM | POA: Diagnosis present

## 2022-02-09 DIAGNOSIS — R1013 Epigastric pain: Secondary | ICD-10-CM | POA: Diagnosis present

## 2022-02-09 DIAGNOSIS — D649 Anemia, unspecified: Secondary | ICD-10-CM | POA: Diagnosis not present

## 2022-02-09 DIAGNOSIS — K2970 Gastritis, unspecified, without bleeding: Secondary | ICD-10-CM | POA: Diagnosis present

## 2022-02-09 DIAGNOSIS — R933 Abnormal findings on diagnostic imaging of other parts of digestive tract: Secondary | ICD-10-CM | POA: Diagnosis not present

## 2022-02-09 DIAGNOSIS — D539 Nutritional anemia, unspecified: Secondary | ICD-10-CM | POA: Diagnosis not present

## 2022-02-09 DIAGNOSIS — K315 Obstruction of duodenum: Secondary | ICD-10-CM | POA: Insufficient documentation

## 2022-02-09 DIAGNOSIS — D72829 Elevated white blood cell count, unspecified: Secondary | ICD-10-CM | POA: Diagnosis not present

## 2022-02-09 DIAGNOSIS — K259 Gastric ulcer, unspecified as acute or chronic, without hemorrhage or perforation: Principal | ICD-10-CM | POA: Insufficient documentation

## 2022-02-09 DIAGNOSIS — J309 Allergic rhinitis, unspecified: Secondary | ICD-10-CM | POA: Diagnosis present

## 2022-02-09 HISTORY — DX: Allergic rhinitis, unspecified: J30.9

## 2022-02-09 HISTORY — DX: Peptic ulcer, site unspecified, unspecified as acute or chronic, without hemorrhage or perforation: K27.9

## 2022-02-09 LAB — CBC
HCT: 35.5 % — ABNORMAL LOW (ref 36.0–46.0)
Hemoglobin: 11.9 g/dL — ABNORMAL LOW (ref 12.0–15.0)
MCH: 33.9 pg (ref 26.0–34.0)
MCHC: 33.5 g/dL (ref 30.0–36.0)
MCV: 101.1 fL — ABNORMAL HIGH (ref 80.0–100.0)
Platelets: 208 10*3/uL (ref 150–400)
RBC: 3.51 MIL/uL — ABNORMAL LOW (ref 3.87–5.11)
RDW: 12.4 % (ref 11.5–15.5)
WBC: 12.2 10*3/uL — ABNORMAL HIGH (ref 4.0–10.5)
nRBC: 0 % (ref 0.0–0.2)

## 2022-02-09 LAB — COMPREHENSIVE METABOLIC PANEL
ALT: 19 U/L (ref 0–44)
AST: 22 U/L (ref 15–41)
Albumin: 4.7 g/dL (ref 3.5–5.0)
Alkaline Phosphatase: 61 U/L (ref 38–126)
Anion gap: 7 (ref 5–15)
BUN: 32 mg/dL — ABNORMAL HIGH (ref 6–20)
CO2: 29 mmol/L (ref 22–32)
Calcium: 9.3 mg/dL (ref 8.9–10.3)
Chloride: 107 mmol/L (ref 98–111)
Creatinine, Ser: 0.82 mg/dL (ref 0.44–1.00)
GFR, Estimated: >60 mL/min (ref >60)
Glucose, Bld: 173 mg/dL — ABNORMAL HIGH (ref 70–99)
Potassium: 4 mmol/L (ref 3.5–5.1)
Sodium: 143 mmol/L (ref 135–145)
Total Bilirubin: 0.9 mg/dL (ref 0.3–1.2)
Total Protein: 7.8 g/dL (ref 6.5–8.1)

## 2022-02-09 LAB — URINALYSIS, ROUTINE W REFLEX MICROSCOPIC
Bilirubin Urine: NEGATIVE
Glucose, UA: NEGATIVE mg/dL
Hgb urine dipstick: NEGATIVE
Ketones, ur: 20 mg/dL — AB
Leukocytes,Ua: NEGATIVE
Nitrite: NEGATIVE
Protein, ur: NEGATIVE mg/dL
Specific Gravity, Urine: 1.021 (ref 1.005–1.030)
pH: 6 (ref 5.0–8.0)

## 2022-02-09 LAB — WET PREP, GENITAL
Clue Cells Wet Prep HPF POC: NONE SEEN
Sperm: NONE SEEN
Trich, Wet Prep: NONE SEEN
WBC, Wet Prep HPF POC: <10 (ref <10)
Yeast Wet Prep HPF POC: NONE SEEN

## 2022-02-09 LAB — LIPASE, BLOOD: Lipase: 29 U/L (ref 11–51)

## 2022-02-09 MED ORDER — TRIAMCINOLONE ACETONIDE 55 MCG/ACT NA AERO
2.0000 | INHALATION_SPRAY | Freq: Every day | NASAL | Status: DC
Start: 2022-02-10 — End: 2022-02-10

## 2022-02-09 MED ORDER — PANTOPRAZOLE SODIUM 40 MG IV SOLR: 40.0000 mg | Freq: Two times a day (BID) | INTRAVENOUS | Status: DC

## 2022-02-09 MED ORDER — SODIUM CHLORIDE 0.9 % IV SOLN
2.0000 g | Freq: Once | INTRAVENOUS | Status: AC
  Administered 2022-02-09: 2 g via INTRAVENOUS
  Filled 2022-02-09: qty 20

## 2022-02-09 MED ORDER — ONDANSETRON HCL 4 MG/2ML IJ SOLN
4.0000 mg | Freq: Once | INTRAMUSCULAR | Status: AC
  Administered 2022-02-09: 4 mg via INTRAVENOUS
  Filled 2022-02-09: qty 2

## 2022-02-09 MED ORDER — SODIUM CHLORIDE 0.9 % IV SOLN: 1.0000 g | Freq: Once | INTRAVENOUS | Status: DC

## 2022-02-09 MED ORDER — NALOXONE HCL 0.4 MG/ML IJ SOLN: 0.4000 mg | INTRAMUSCULAR | Status: DC | PRN

## 2022-02-09 MED ORDER — PANTOPRAZOLE SODIUM 40 MG IV SOLR
40.0000 mg | Freq: Once | INTRAVENOUS | Status: AC
  Administered 2022-02-09: 40 mg via INTRAVENOUS
  Filled 2022-02-09: qty 10

## 2022-02-09 MED ORDER — FENTANYL CITRATE PF 50 MCG/ML IJ SOSY: 25.0000 ug | PREFILLED_SYRINGE | INTRAMUSCULAR | Status: DC | PRN

## 2022-02-09 MED ORDER — SODIUM CHLORIDE 0.9 % IV SOLN
2.0000 g | INTRAVENOUS | Status: DC
  Administered 2022-02-10 – 2022-02-11 (×2): 2 g via INTRAVENOUS
  Filled 2022-02-09 (×2): qty 20

## 2022-02-09 MED ORDER — IOHEXOL 300 MG/ML  SOLN
100.0000 mL | Freq: Once | INTRAMUSCULAR | Status: AC | PRN
  Administered 2022-02-09: 100 mL via INTRAVENOUS

## 2022-02-09 MED ORDER — ONDANSETRON HCL 4 MG/2ML IJ SOLN: 4.0000 mg | Freq: Four times a day (QID) | INTRAMUSCULAR | Status: DC | PRN

## 2022-02-09 MED ORDER — MORPHINE SULFATE (PF) 2 MG/ML IV SOLN
2.0000 mg | Freq: Once | INTRAVENOUS | Status: AC
  Administered 2022-02-09: 2 mg via INTRAVENOUS
  Filled 2022-02-09: qty 1

## 2022-02-09 MED ORDER — ACETAMINOPHEN 325 MG PO TABS
650.0000 mg | ORAL_TABLET | Freq: Four times a day (QID) | ORAL | Status: DC | PRN
  Administered 2022-02-10: 650 mg via ORAL
  Filled 2022-02-09: qty 2

## 2022-02-09 MED ORDER — PANTOPRAZOLE INFUSION (NEW) - SIMPLE MED
8.0000 mg/h | INTRAVENOUS | Status: DC
  Administered 2022-02-09 – 2022-02-11 (×4): 8 mg/h via INTRAVENOUS
  Filled 2022-02-09: qty 80
  Filled 2022-02-09 (×3): qty 100
  Filled 2022-02-09 (×3): qty 80

## 2022-02-09 MED ORDER — LACTATED RINGERS IV SOLN: INTRAVENOUS | Status: DC

## 2022-02-09 MED ORDER — ACETAMINOPHEN 650 MG RE SUPP: 650.0000 mg | Freq: Four times a day (QID) | RECTAL | Status: DC | PRN

## 2022-02-09 MED ORDER — METRONIDAZOLE 500 MG/100ML IV SOLN
500.0000 mg | Freq: Two times a day (BID) | INTRAVENOUS | Status: DC
  Administered 2022-02-09 – 2022-02-11 (×4): 500 mg via INTRAVENOUS
  Filled 2022-02-09 (×4): qty 100

## 2022-02-09 NOTE — Assessment & Plan Note (Signed)
 #)   Epigastric pain: 1 day of progressive epigastric pain with perennial exacerbation associated with nausea and nonbilious, nonbloody emesis, with CT abdomen/pelvis showing evidence of gastritis along with potential peptic ulcer disease in addition to potential for localized perforation in the absence of evidence of free perforation.  Of note, no physical exam evidence of acute peritoneal signs at this time.  CT abdomen/pelvis showed no evidence of additional acute intra-abdominal or acute intrapelvic process, as further detailed above.  Not on any blood thinners as an outpatient.  Limited alcohol consumption at a rate of 1-2 beers per week.  Limited NSAID use, noting 400 to 600 mg of ibuprofen at frequency of less than 1 time per week.  This is all in the context of a documented history of peptic ulcer disease, not currently on PPI as an outpatient.  EDP has consulted on-call [facility], who will formally consult, with additional recommendations pending.  Additionally, given radiology read that includes potential for localized gastric perforation, EDP also discussed patient's case with on-call general surgery, Dr. Michaelle Birks, who will formally consult.  In the context of acute prerenal azotemia, we will continue the Protonix drip initiated in the ED, and the closely monitor ensuing H&H trend, as further outlined below, while noting presenting hemoglobin to be at baseline.  Appears hemodynamically stable at this time.  No known history of underlying liver disease.  However, given aforementioned potential for localized gastric perforation, will continue the Rocephin/Flagyl that was initiated in the ED today.  Does not meet SIRS criteria for sepsis at this time.    Plan: NPO.  Continue Protonix drip, as above.  Continue Rocephin and Flagyl, as above.  General surgery and stressors are gastroenterology consulted, as further detailed above.  Repeat CMP and CBC in the morning.  Add Unser  magnesium level.  Continuous IV fluids.  Prn IV fentanyl.  As needed IV Zofran.  Add on INR.  Every 4 hours H&H's ordered through 9 AM on 02/10/2022.  Type and screen ordered.  Check EKG.  Refraining from pharmacologic DVT prophylaxis.  SCDs.

## 2022-02-09 NOTE — Assessment & Plan Note (Signed)
 #)   Chronic anemia: Documented history of such, associated baseline hemoglobin of 11 to 12 g with presenting hemoglobin consistent with this range, and associated with borderline macrocytic finding, along with normocytic properties and nonelevated RDW.  Given presenting gastritis versus peptic ulcer disease and potential for lupus gastric perforation, will continue to trend serial hemoglobin, as detailed below.   Plan: Every 4 hours H&H's through 9 AM tomorrow.  Check INR.  Repeat CMP in the morning as well as CBC at that time.  Further evaluation and management of presenting epigastric discomfort, as above.  Additional chart review to determine timing of most recent colonoscopy.

## 2022-02-09 NOTE — H&P (Signed)
History and Physical    PLEASE NOTE THAT DRAGON DICTATION SOFTWARE WAS USED IN THE CONSTRUCTION OF THIS NOTE.   Paula Ramos YOK:[id number] DOB: [birth year] DOA: 02/09/2022  PCP: Glenis Smoker, MD  Patient coming from: home   I have personally briefly reviewed patient's old medical records in [place]  Chief Complaint: Epigastric pain  HPI: Paula Ramos is a 56 y.o. female with medical history significant for peptic ulcer disease, chronic anemia with baseline hemoglobin 11-12, allergic rhinitis, who is admitted to [facility] on 02/09/2022 with epigastric pain after presenting from home to [facility] ED complaining of such.   The patient notes 1 day of sharp, nonradiating Epigastric pain that has been constant since onset and worse when attempting to eat or drink anything over that timeframe.  This been associated with intermittent nausea resulting in 2-3 episodes of nonbloody, nonbilious emesis.  She also denies any associated coffee-ground appearance to this emesis.  Denies any recent melena or hematochezia.  No recent diarrhea.  Denies any recent trauma or travel.  She notes a history of peptic ulcer disease that was diagnosed a few years ago when she was experiencing very similar abdominal discomfort associate with nausea and vomiting. At that time she was evaluated by [facility] gastroenterology, reportedly underwent EGD as part of diagnostic evaluation.  Not currently on any PPI or H2 blocker.  Reports that her alcohol consumption consists of 1-2 beers today, without any recent increase in volume or frequency of hyper alcohol consumption.  She notes NSAID use in the form of ibuprofen 400 to 600 mg at a frequency of less than 1 time per week.  Not on any blood thinners, including no aspirin.  No known liver disease.  Not a/w any recent subjective fever, chills, rigors, generalized myalgias.  No rash, dysuria, gross hematuria.  Denies any recent chest pain, shortness of breath,  palpitations, dizziness, presyncope, syncope.  Medical history also notable for chronic anemia with baseline hemoglobin 11-12, with most recent prior hemoglobin data point noted to be 11.4 on 04/22/2021.     ED Course:  Vital signs in the ED were notable for the following: Afebrile; heart rate 59-86; pressure 116/64 -138/80; respiratory rate 16-18, oxygen saturation 96% on room air.  Labs were notable for the following: CMP notable for the following: BUN 32, creatinine 0.82.  Most recent prior serum creatinine did 10 23 He 1522, liver enzymes within normal limits.  Lipase 29.  CBC notable for white cell count 12,200, hemoglobin 11.9 associated with MCV 101 and also associated with normocytic finding and nonelevated RDW, platelet count 2 8.  Urinalysis notable for blood cells,'s and specific remedy 1.021.  Imaging and additional notable ED work-up: CT abdomen/pelvis with contrast showed moderate gastric body and antral wall thickening with mucosal hyperenhancement reportedly consistent with gastritis; this imaging also noted evidence of gastric towards the cephalad aspect of the gastric antrum, with radiology read noting potentially representative of ulcer versus localized perforation, without any overt evidence of free perforation.  CT abdomen/pelvis showed no evidence of additional intra-abdominal or intrapelvic process, including no evidence of acute cholecystitis, biliary obstruction, or acute pancreatitis.  EDP has consulted on-call [place] gastroenterology, who will formally consult, with additional recommendations pending.  Additionally, given radiology read that includes potential for localized gastric perforation, EDP also discussed patient's case with on-call general surgery, Dr. Michaelle Birks, who will formally consult.  While in the ED, the following were administered: Morphine 2 mg IV x1, Zofran  4 mg IV x1, Protonix 40 mg IV x1 followed by initiation of Protonix drip, Rocephin, IV  Flagyl.  Subsequently, the patient was admitted for further evaluation and management of presenting epigastric discomfort, with CT evidence of gastritis versus peptic ulcer disease, as well as potential for localized gastric perforation, with presenting labs also notable for dehydration and mild leukocytosis.    Review of Systems: As per HPI otherwise 10 point review of systems negative.   Past Medical History:  Diagnosis Date   Allergic rhinitis    Peptic ulcer disease     Past Surgical History:  Procedure Laterality Date   NO PAST SURGERIES      Social History:  reports that she has never smoked. She has never used smokeless tobacco. She reports current alcohol use of about 2.0 standard drinks per week. She reports that she does not use drugs.   No Known Allergies  Family History  Problem Relation Age of Onset   Kidney disease Mother    Colon cancer Neg Hx    Esophageal cancer Neg Hx    Rectal cancer Neg Hx    Stomach cancer Neg Hx     Family history reviewed and not pertinent    Prior to Admission medications   Medication Sig Start Date End Date Taking? Authorizing Provider  bisacodyl (DULCOLAX) 5 MG EC tablet Take 5 mg by mouth once.    [provider]  cetirizine (ZYRTEC) 10 MG tablet Take 1 tablet (10 mg total) by mouth daily. Patient not taking: No sig reported 11/24/16   Delia Chimes A, MD  ibuprofen (ADVIL,MOTRIN) 200 MG tablet Take 2 tablets by mouth as needed.    [provider]  Multiple Vitamin (MULTI-VITAMIN DAILY PO) Take 1 tablet by mouth daily.    [provider]  tiZANidine (ZANAFLEX) 2 MG tablet Take 1-2 tablets (2-4 mg total) by mouth every 6 (six) hours as needed for muscle spasms. 04/22/21   Wieters, Hallie C, PA-C  triamcinolone (NASACORT) 55 MCG/ACT AERO nasal inhaler Place 2 sprays into the nose daily. 04/22/21   Wieters, Elesa Hacker, PA-C     Objective    Physical Exam: Vitals:   02/09/22 1700 02/09/22 1800  02/09/22 1830 02/09/22 1900  BP: (!) 144/60 132/70 120/63 (!) 114/59  Pulse: 72 85 73 71  Resp: $Remo'16 18 17 17  'OmBkp$ Temp:      TempSrc:      SpO2: 100% 99% 96% 96%  Weight:      Height:        General: appears to be stated age; alert, oriented Skin: warm, dry, no rash Head:  AT/[place] Mouth:  Oral mucosa membranes appear dry, normal dentition Neck: supple; trachea midline Heart:  RRR; did not appreciate any M/R/G Lungs: CTAB, did not appreciate any wheezes, rales, or rhonchi Abdomen: + BS; soft, ND, tenderness with palpation over the epigastrium, in the absence of any associated guarding, rigidity, or rebound tenderness Vascular: 2+ pedal pulses b/l; 2+ radial pulses b/l Extremities: no peripheral edema, no muscle wasting Neuro: strength and sensation intact in upper and lower extremities b/l      Labs on Admission: I have personally reviewed following labs and imaging studies  CBC: Recent Labs  Lab 02/09/22 1338  WBC 12.2*  HGB 11.9*  HCT 35.5*  MCV 101.1*  PLT 779   Basic Metabolic Panel: Recent Labs  Lab 02/09/22 1338  NA 143  K 4.0  CL 107  CO2 29  GLUCOSE 173*  BUN 32*  CREATININE 0.82  CALCIUM 9.3   GFR: Estimated Creatinine Clearance: 46.6 mL/min (by C-G formula based on SCr of 0.82 mg/dL). Liver Function Tests: Recent Labs  Lab 02/09/22 1338  AST 22  ALT 19  ALKPHOS 61  BILITOT 0.9  PROT 7.8  ALBUMIN 4.7   Recent Labs  Lab 02/09/22 1338  LIPASE 29   No results for input(s): AMMONIA in the last 168 hours. Coagulation Profile: No results for input(s): INR, PROTIME in the last 168 hours. Cardiac Enzymes: No results for input(s): CKTOTAL, CKMB, CKMBINDEX, TROPONINI in the last 168 hours. BNP (last 3 results) No results for input(s): PROBNP in the last 8760 hours. HbA1C: No results for input(s): HGBA1C in the last 72 hours. CBG: No results for input(s): GLUCAP in the last 168 hours. Lipid Profile: No results for input(s): CHOL, HDL, LDLCALC,  TRIG, CHOLHDL, LDLDIRECT in the last 72 hours. Thyroid Function Tests: No results for input(s): TSH, T4TOTAL, FREET4, T3FREE, THYROIDAB in the last 72 hours. Anemia Panel: No results for input(s): VITAMINB12, FOLATE, FERRITIN, TIBC, IRON, RETICCTPCT in the last 72 hours. Urine analysis:    Component Value Date/Time   COLORURINE YELLOW 02/09/2022 [place] 02/09/2022 1605   LABSPEC 1.021 02/09/2022 1605   PHURINE 6.0 02/09/2022 1605   GLUCOSEU NEGATIVE 02/09/2022 1605   HGBUR NEGATIVE 02/09/2022 [place] 02/09/2022 1605   BILIRUBINUR negative 04/22/2021 1148   KETONESUR 20 (A) 02/09/2022 1605   PROTEINUR NEGATIVE 02/09/2022 1605   UROBILINOGEN 0.2 04/22/2021 1148   NITRITE NEGATIVE 02/09/2022 1605   LEUKOCYTESUR NEGATIVE 02/09/2022 1605    Radiological Exams on Admission: CT ABDOMEN PELVIS W CONTRAST  Result Date: 02/09/2022 CLINICAL DATA:  Left lower quadrant pain.  Emesis. EXAM: CT ABDOMEN AND PELVIS WITH CONTRAST TECHNIQUE: Multidetector CT imaging of the abdomen and pelvis was performed using the standard protocol following bolus administration of intravenous contrast. RADIATION DOSE REDUCTION: This exam was performed according to the departmental dose-optimization program which includes automated exposure control, adjustment of the mA and/or kV according to patient size and/or use of iterative reconstruction technique. CONTRAST:  153mL OMNIPAQUE IOHEXOL 300 MG/ML  SOLN COMPARISON:  12/20/2021 renal ultrasound.  No comparison CT. FINDINGS: Lower chest: Clear lung bases. Normal heart size without pericardial or pleural effusion. Mild pectus excavatum deformity. Hepatobiliary: Normal liver. Normal gallbladder, without biliary ductal dilatation. Pancreas: Normal, without mass or ductal dilatation. Spleen: Normal in size, without focal abnormality. Adrenals/Urinary Tract: Normal adrenal glands. Bilateral too small to characterize renal lesions which are  well-circumscribed. Interpolar right renal lesion measures 6 mm and greater than fluid density on 17/7 and 27/2. No hydronephrosis. Normal urinary bladder. Stomach/Bowel: The gastric body and antrum are diffusely thick walled with mucosal hyperenhancement, including on 34/2 and coronal image 46. Gas tracks along the periphery of the superior aspect of the gastric antrum including on 32/2 and 42/4. Normal colon, appendix, and terminal ileum. Normal small bowel. Vascular/Lymphatic: Aortic atherosclerosis. No abdominopelvic adenopathy. Reproductive: Normal uterus and adnexa. Other: No significant free fluid.  Mild pelvic floor laxity. 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 IMPRESSION: 1. Moderate gastric body and antral wall thickening with mucosal hyperenhancement, consistent with gastritis. Gas tracking towards the cephalad aspect of the gastric antrum could represent an ulcer or even localized perforation. No free perforation identified. 2. No other explanation for abdominal pain. 3. Too small to characterize renal lesions. Interpolar right renal 6 mm lesion demonstrates complexity. Consider re-evaluation with pre  and post contrast abdominal MRI at 12 months. 4.  Aortic Atherosclerosis (ICD10-I70.0). Electronically Signed   By: Abigail Miyamoto M.D.   On: 02/09/2022 17:45       Assessment/Plan    Principal Problem:   Epigastric pain Active Problems:   Allergic rhinitis   Acute prerenal azotemia   Leukocytosis   Chronic anemia     #) Epigastric pain: 1 day of progressive epigastric pain with perennial exacerbation associated with nausea and nonbilious, nonbloody emesis, with CT abdomen/pelvis showing evidence of gastritis along with potential peptic ulcer disease in addition to potential for localized perforation in the absence of evidence of free perforation.  Of note, no physical exam evidence of acute peritoneal signs at this time.  CT abdomen/pelvis showed no evidence  of additional acute intra-abdominal or acute intrapelvic process, as further detailed above.  Not on any blood thinners as an outpatient.  Limited alcohol consumption at a rate of 1-2 beers per week.  Limited NSAID use, noting 400 to 600 mg of ibuprofen at frequency of less than 1 time per week.  This is all in the context of a documented history of peptic ulcer disease, not currently on PPI as an outpatient.  EDP has consulted on-call [place] gastroenterology, who will formally consult, with additional recommendations pending.  Additionally, given radiology read that includes potential for localized gastric perforation, EDP also discussed patient's case with on-call general surgery, Dr. Michaelle Birks, who will formally consult.  In the context of acute prerenal azotemia, we will continue the Protonix drip initiated in the ED, and the closely monitor ensuing H&H trend, as further outlined below, while noting presenting hemoglobin to be at baseline.  Appears hemodynamically stable at this time.  No known history of underlying liver disease.  However, given aforementioned potential for localized gastric perforation, will continue the Rocephin/Flagyl that was initiated in the ED today.  Does not meet SIRS criteria for sepsis at this time.    Plan: NPO.  Continue Protonix drip, as above.  Continue Rocephin and Flagyl, as above.  General surgery and stressors are gastroenterology consulted, as further detailed above.  Repeat CMP and CBC in the morning.  Add Unser magnesium level.  Continuous IV fluids.  Prn IV fentanyl.  As needed IV Zofran.  Add on INR.  Every 4 hours H&H's ordered through 9 AM on 02/10/2022.  Type and screen ordered.  Check EKG.  Refraining from pharmacologic DVT prophylaxis.  SCDs.        #) Acute prerenal azotemia: Noted on presenting labs, without corresponding acute kidney injury, which appears consistent with dehydration in the setting of recent decrease in oral intake as well as  increased GI losses in the form of 2-3 episodes of nonbloody, nonbilious emesis earlier today.  However, in the context of concern for gastritis versus peptic ulcer disease, differential also includes BUN elevation on the basis of acute upper GI bleed, as above, while noting that present hemoglobin at baseline, with evidence of hemodynamic stability thus far.  We will provide continuous IV fluids, closely monitor ensuing hemoglobin trend.   Plan: Continuous IV fluids, as above.  Monitor strict I's and O's and daily weights.  Every 4 hour hemoglobin checks ordered through 9 AM tomorrow.  Repeat CMP and CBC in the morning.  Add on INR.  Further evaluation management of presenting epigastric discomfort, including GI consultation, as above.           #) Leukocytosis: Presenting CBC reflects mildly elevated white cell  count of  12,200. Suspect an element of hemoconcentration in the setting of dehydration, as established above, with likely additional inflammatory contribution in the setting of presenting epigastric discomfort associated with gastritis versus peptic ulcer disease, as above. No evidence to suggest underlying infectious process at this time, but increased risk for ensuing development of such given potential for localized gas or perforation identified on CT scan.  Consequently, we will continue the Rocephin and Flagyl initiated in the ED this evening.  Of note, surgery.  Not currently met for sepsis.  Appears hemodynamically stable.  Of note, UA not consistent with UTI, and no acute respiratory symptoms to warrant chest x-ray at this time.  Plan: Repeat CBC with diff in the morning.  Monitor strict I's and O's, daily weights.  IV fluids as above, further evaluation management of presenting epigastric discomfort, as above.  Continue Rocephin and Flagyl, as above.         #) Chronic anemia: Documented history of such, associated baseline hemoglobin of 11 to 12 g with presenting  hemoglobin consistent with this range, and associated with borderline macrocytic finding, along with normocytic properties and nonelevated RDW.  Given presenting gastritis versus peptic ulcer disease and potential for lupus gastric perforation, will continue to trend serial hemoglobin, as detailed below.   Plan: Every 4 hours H&H's through 9 AM tomorrow.  Check INR.  Repeat CMP in the morning as well as CBC at that time.  Further evaluation and management of presenting epigastric discomfort, as above.  Additional chart review to determine timing of most recent colonoscopy.           #) Allergic Rhinitis: documented h/o such, on scheduled intranasal corticosteroid as outpatient.    Plan: cont home Nasacort.          DVT prophylaxis: SCD's   Code Status: Full code Family Communication: none Disposition Plan: Per Rounding Team Consults called: [place] gastroenterology and general surgery (Dr. Michaelle Birks) consulted, as further detailed above;  Admission status: Inpatient   PLEASE NOTE THAT DRAGON DICTATION SOFTWARE WAS USED IN THE CONSTRUCTION OF THIS NOTE.   [place] DO [facility] [place]   02/09/2022, 8:17 PM

## 2022-02-09 NOTE — Assessment & Plan Note (Signed)
  #)   Acute prerenal azotemia: Noted on presenting labs, without corresponding acute kidney injury, which appears consistent with dehydration in the setting of recent decrease in oral intake as well as increased GI losses in the form of 2-3 episodes of nonbloody, nonbilious emesis earlier today.  However, in the context of concern for gastritis versus peptic ulcer disease, differential also includes BUN elevation on the basis of acute upper GI bleed, as above, while noting that present hemoglobin at baseline, with evidence of hemodynamic stability thus far.  We will provide continuous IV fluids, closely monitor ensuing hemoglobin trend.   Plan: Continuous IV fluids, as above.  Monitor strict I's and O's and daily weights.  Every 4 hour hemoglobin checks ordered through 9 AM tomorrow.  Repeat CMP and CBC in the morning.  Add on INR.  Further evaluation management of presenting epigastric discomfort, including GI consultation, as above.

## 2022-02-09 NOTE — ED Notes (Signed)
Patient transported to CT 

## 2022-02-09 NOTE — Assessment & Plan Note (Signed)
 #)   Leukocytosis: Presenting CBC reflects mildly elevated white cell count of  12,200. Suspect an element of hemoconcentration in the setting of dehydration, as established above, with likely additional inflammatory contribution in the setting of presenting epigastric discomfort associated with gastritis versus peptic ulcer disease, as above. No evidence to suggest underlying infectious process at this time, but increased risk for ensuing development of such given potential for localized gas or perforation identified on CT scan.  Consequently, we will continue the Rocephin and Flagyl initiated in the ED this evening.  Of note, surgery.  Not currently met for sepsis.  Appears hemodynamically stable.  Of note, UA not consistent with UTI, and no acute respiratory symptoms to warrant chest x-ray at this time.  Plan: Repeat CBC with diff in the morning.  Monitor strict I's and O's, daily weights.  IV fluids as above, further evaluation management of presenting epigastric discomfort, as above.  Continue Rocephin and Flagyl, as above.

## 2022-02-09 NOTE — Consult Note (Signed)
Paula Ramos [birth year]  [id number].    Requesting MD: Paula Hearing, PA-C Chief Complaint/Reason for Consult: abdominal pain, gastritis  HPI:  [name] is a 56 yo female who presented to the ED today with abdominal pain. She says that for many years, she has had intermittent bloating and nausea with occasional vomiting. This morning she began having lower abdominal pain, as well as nausea and vomiting. She says the emesis was dark Hou but there was no bright red blood. She also reports a history of anemia and is on iron. She denies unintentional weight loss or loss of appetite. In the ED labs are significant for a mild leukocytosis (12). A CT scan showed thickening of the gastric body and antrum, with a possible contained perforation. General surgery was consulted.  The patient has not had any prior abdominal surgeries. She occasionally takes ibuprofen at home as needed but not every day. Denies other NSAID use. She has never had an EGD. She had a colonoscopy in 2019 that did not show any masses or polyps.  ROS: Review of Systems  Constitutional:  Negative for chills and fever.  Respiratory:  Negative for shortness of breath.   Cardiovascular:  Negative for chest pain.  Gastrointestinal:  Positive for abdominal pain, nausea and vomiting.  Genitourinary:  Negative for dysuria.  Neurological:  Positive for weakness. Negative for loss of consciousness.   Family History  Problem Relation Age of Onset   Kidney disease Mother    Colon cancer Neg Hx    Esophageal cancer Neg Hx    Rectal cancer Neg Hx    Stomach cancer Neg Hx     Past Medical History:  Diagnosis Date   Allergic rhinitis    Peptic ulcer disease     Past Surgical History:  Procedure Laterality Date   NO PAST SURGERIES      Social History:  reports that she has never smoked. She has never used smokeless tobacco. She reports current alcohol use of about 2.0 standard drinks per week. She reports that she does  not use drugs.  Allergies: No Known Allergies  (Not in a hospital admission)    Physical Exam: Blood pressure (!) 114/59, pulse 71, temperature 98.4 F (36.9 C), temperature source Oral, resp. rate 17, height 4\' 10"  (1.473 m), weight 39.5 kg, SpO2 96 %. General: resting comfortably, appears stated age, no apparent distress Neurological: alert and oriented, no focal deficits HEENT: normocephalic, atraumatic, no scleral icterus CV: regular rate and rhythm, no murmurs, extremities warm and well-perfused Respiratory: normal work of breathing on room air, symmetric chest wall expansion Abdomen: soft, nondistended, focally tender in the periumbilical area. No masses or organomegaly. Extremities: warm and well-perfused, no deformities, moving all extremities spontaneously Psychiatric: normal mood and affect Skin: warm and dry, no jaundice, no rashes or lesions   Results for orders placed or performed during the hospital encounter of 02/09/22 (from the past 48 hour(s))  Lipase, blood     Status: None   Collection Time: 02/09/22  1:38 PM  Result Value Ref Range   Lipase 29 11 - 51 U/L    Comment: Performed at [facility], [address] [place], [place]  Comprehensive metabolic panel     Status: Abnormal   Collection Time: 02/09/22  1:38 PM  Result Value Ref Range   Sodium 143 135 - 145 mmol/L   Potassium 4.0 3.5 - 5.1 mmol/L   Chloride 107 98 - 111 mmol/L  CO2 29 22 - 32 mmol/L   Glucose, Bld 173 (H) 70 - 99 mg/dL    Comment: Glucose reference range applies only to samples taken after fasting for at least 8 hours.   BUN 32 (H) 6 - 20 mg/dL   Creatinine, Ser 1.610.82 0.44 - 1.00 mg/dL   Calcium 9.3 8.9 - 09.610.3 mg/dL   Total Protein 7.8 6.5 - 8.1 g/dL   Albumin 4.7 3.5 - 5.0 g/dL   AST 22 15 - 41 U/L   ALT 19 0 - 44 U/L   Alkaline Phosphatase 61 38 - 126 U/L   Total Bilirubin 0.9 0.3 - 1.2 mg/dL   GFR, Estimated >04>60 >54>60 mL/min    Comment:  (NOTE) Calculated using the CKD-EPI Creatinine Equation (2021)    Anion gap 7 5 - 15    Comment: Performed at [facility], [address] [place], [place] [address]  CBC     Status: Abnormal   Collection Time: 02/09/22  1:38 PM  Result Value Ref Range   WBC 12.2 (H) 4.0 - 10.5 K/uL   RBC 3.51 (L) 3.87 - 5.11 MIL/uL   Hemoglobin 11.9 (L) 12.0 - 15.0 g/dL   HCT 91.435.5 (L) 78.236.0 - 95.646.0 %   MCV 101.1 (H) 80.0 - 100.0 fL   MCH 33.9 26.0 - 34.0 pg   MCHC 33.5 30.0 - 36.0 g/dL   RDW 21.312.4 08.611.5 - 57.815.5 %   Platelets 208 150 - 400 K/uL   nRBC 0.0 0.0 - 0.2 %    Comment: Performed at [facility], [address] [place], [place] [address]  Urinalysis, Routine w reflex microscopic Urine, Clean Catch     Status: Abnormal   Collection Time: 02/09/22  4:05 PM  Result Value Ref Range   Color, Urine YELLOW YELLOW   APPearance CLEAR CLEAR   Specific Gravity, Urine 1.021 1.005 - 1.030   pH 6.0 5.0 - 8.0   Glucose, UA NEGATIVE NEGATIVE mg/dL   Hgb urine dipstick NEGATIVE NEGATIVE   Bilirubin Urine NEGATIVE NEGATIVE   Ketones, ur 20 (A) NEGATIVE mg/dL   Protein, ur NEGATIVE NEGATIVE mg/dL   Nitrite NEGATIVE NEGATIVE   Leukocytes,Ua NEGATIVE NEGATIVE    Comment: Performed at [facility], [address] [place], [place] [address]  Wet prep, genital     Status: None   Collection Time: 02/09/22  6:07 PM  Result Value Ref Range   Yeast Wet Prep HPF POC NONE SEEN NONE SEEN   Trich, Wet Prep NONE SEEN NONE SEEN   Clue Cells Wet Prep HPF POC NONE SEEN NONE SEEN   WBC, Wet Prep HPF POC <10 <10   Sperm NONE SEEN     Comment: Performed at [facility], [address] [place], [place] [address]   CT ABDOMEN PELVIS W CONTRAST  Result Date: 02/09/2022 CLINICAL DATA:  Left lower quadrant pain.  Emesis. EXAM: CT ABDOMEN AND PELVIS WITH CONTRAST TECHNIQUE: Multidetector CT imaging of the abdomen and pelvis was performed using the  standard protocol following bolus administration of intravenous contrast. RADIATION DOSE REDUCTION: This exam was performed according to the departmental dose-optimization program which includes automated exposure control, adjustment of the mA and/or kV according to patient size and/or use of iterative reconstruction technique. CONTRAST:  100mL OMNIPAQUE IOHEXOL 300 MG/ML  SOLN COMPARISON:  12/20/2021 renal ultrasound.  No comparison CT. FINDINGS: Lower chest: Clear lung bases. Normal heart size without pericardial or pleural effusion.  Mild pectus excavatum deformity. Hepatobiliary: Normal liver. Normal gallbladder, without biliary ductal dilatation. Pancreas: Normal, without mass or ductal dilatation. Spleen: Normal in size, without focal abnormality. Adrenals/Urinary Tract: Normal adrenal glands. Bilateral too small to characterize renal lesions which are well-circumscribed. Interpolar right renal lesion measures 6 mm and greater than fluid density on 17/7 and 27/2. No hydronephrosis. Normal urinary bladder. Stomach/Bowel: The gastric body and antrum are diffusely thick walled with mucosal hyperenhancement, including on 34/2 and coronal image 46. Gas tracks along the periphery of the superior aspect of the gastric antrum including on 32/2 and 42/4. Normal colon, appendix, and terminal ileum. Normal small bowel. Vascular/Lymphatic: Aortic atherosclerosis. No abdominopelvic adenopathy. Reproductive: Normal uterus and adnexa. Other: No significant free fluid.  Mild pelvic floor laxity. 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 IMPRESSION: 1. Moderate gastric body and antral wall thickening with mucosal hyperenhancement, consistent with gastritis. Gas tracking towards the cephalad aspect of the gastric antrum could represent an ulcer or even localized perforation. No free perforation identified. 2. No other explanation for abdominal pain. 3. Too small to characterize renal lesions.  Interpolar right renal 6 mm lesion demonstrates complexity. Consider re-evaluation with pre and post contrast abdominal MRI at 12 months. 4.  Aortic Atherosclerosis (ICD10-I70.0). Electronically Signed   By: Jeronimo Greaves M.D.   On: 02/09/2022 17:45      Assessment/Plan This is a 57 yo female presenting with acute abdominal pain with a history of chronic bloating and reflux symptoms. I personally reviewed her CT, which shows thickening of the distal stomach consistent with gastritis. There is no pneumoperitoneum to suggest a frank perforation, and the patient's abdominal exam is benign. - Medical treatment for peptic ulcer disease - PPI, carafate - Consult GI for EGD. Underlying malignancy needs to be excluded. - No indication for acute surgical intervention. Surgery will follow.   Sophronia Simas, MD [facility] Surgery General, Hepatobiliary and Pancreatic Surgery 02/09/22 7:44 PM

## 2022-02-09 NOTE — Assessment & Plan Note (Signed)
 #)   Allergic Rhinitis: documented h/o such, on scheduled intranasal corticosteroid as outpatient.    Plan: cont home Nasacort.

## 2022-02-09 NOTE — ED Provider Notes (Signed)
[facility] DEPT Provider Note   CSN: [id number] Arrival date & time: 02/09/22  1306     History  Chief Complaint  Patient presents with   Abdominal Pain   Emesis    Paula Ramos is a 56 y.o. female with no significant past medical history presents with concern for cute onset left lower quadrant pain that began this morning along with some nausea, vomiting patient reports that she had some dark Grobe/red vomit without clear bright red blood.  She has not had any repeat episodes of vomiting since earlier this morning.  She also endorses she has some dyspareunia, denies vaginal discharge, vaginal bleeding.  She is concerned about possible gynecologic cancer versus infection versus other.  Patient denies previous history of intra-abdominal surgery.  She initially endorsed some difficulty breathing on triage, but denies at this time.  Denies any chest pain, chills, recent weight loss.   Abdominal Pain Associated symptoms: vomiting   Emesis Associated symptoms: abdominal pain       Home Medications Prior to Admission medications   Medication Sig Start Date End Date Taking? Authorizing Provider  bisacodyl (DULCOLAX) 5 MG EC tablet Take 5 mg by mouth once.    [provider]  cetirizine (ZYRTEC) 10 MG tablet Take 1 tablet (10 mg total) by mouth daily. Patient not taking: No sig reported 11/24/16   Delia Chimes A, MD  ibuprofen (ADVIL,MOTRIN) 200 MG tablet Take 2 tablets by mouth as needed.    [provider]  Multiple Vitamin (MULTI-VITAMIN DAILY PO) Take 1 tablet by mouth daily.    [provider]  tiZANidine (ZANAFLEX) 2 MG tablet Take 1-2 tablets (2-4 mg total) by mouth every 6 (six) hours as needed for muscle spasms. 04/22/21   Wieters, Hallie C, PA-C  triamcinolone (NASACORT) 55 MCG/ACT AERO nasal inhaler Place 2 sprays into the nose daily. 04/22/21   Wieters, Hallie C, PA-C      Allergies    Patient has no known allergies.     Review of Systems   Review of Systems  Gastrointestinal:  Positive for abdominal pain and vomiting.  All other systems reviewed and are negative.  Physical Exam Updated Vital Signs BP (!) 114/59   Pulse 71   Temp 98.4 F (36.9 C) (Oral)   Resp 17   Ht 4\' 10"  (1.473 m)   Wt 39.5 kg   SpO2 96%   BMI 18.18 kg/m  Physical Exam Vitals and nursing note reviewed.  Constitutional:      General: She is not in acute distress.    Appearance: Normal appearance.     Comments: Patient is quite thin but overall well-appearing  HENT:     Head: Normocephalic and atraumatic.  Eyes:     General:        Right eye: No discharge.        Left eye: No discharge.  Cardiovascular:     Rate and Rhythm: Normal rate and regular rhythm.     Heart sounds: No murmur heard.   No friction rub. No gallop.  Pulmonary:     Effort: Pulmonary effort is normal.     Breath sounds: Normal breath sounds.  Abdominal:     General: Bowel sounds are normal.     Palpations: Abdomen is soft.     Comments: Significantly tender to palpation left lower quadrant with guarding, no rebound, rigidity.  Normal bowel sounds throughout.  No significant tenderness palpation suprapubically.  Skin:  General: Skin is warm and dry.     Capillary Refill: Capillary refill takes less than 2 seconds.  Neurological:     Mental Status: She is alert and oriented to person, place, and time.  Psychiatric:        Mood and Affect: Mood normal.        Behavior: Behavior normal.    ED Results / Procedures / Treatments   Labs (all labs ordered are listed, but only abnormal results are displayed) Labs Reviewed  COMPREHENSIVE METABOLIC PANEL - Abnormal; Notable for the following components:      Result Value   Glucose, Bld 173 (*)    BUN 32 (*)    All other components within normal limits  CBC - Abnormal; Notable for the following components:   WBC 12.2 (*)    RBC 3.51 (*)    Hemoglobin 11.9 (*)    HCT 35.5 (*)    MCV 101.1  (*)    All other components within normal limits  URINALYSIS, ROUTINE W REFLEX MICROSCOPIC - Abnormal; Notable for the following components:   Ketones, ur 20 (*)    All other components within normal limits  WET PREP, GENITAL  LIPASE, BLOOD  CBC WITH DIFFERENTIAL/PLATELET  COMPREHENSIVE METABOLIC PANEL  MAGNESIUM  MAGNESIUM  PROTIME-INR  HEMOGLOBIN AND HEMATOCRIT, BLOOD  PROTIME-INR  TYPE AND SCREEN  GC/CHLAMYDIA PROBE AMP ([facility]) NOT AT [facility]    EKG None  Radiology CT ABDOMEN PELVIS W CONTRAST  Result Date: 02/09/2022 CLINICAL DATA:  Left lower quadrant pain.  Emesis. EXAM: CT ABDOMEN AND PELVIS WITH CONTRAST TECHNIQUE: Multidetector CT imaging of the abdomen and pelvis was performed using the standard protocol following bolus administration of intravenous contrast. RADIATION DOSE REDUCTION: This exam was performed according to the departmental dose-optimization program which includes automated exposure control, adjustment of the mA and/or kV according to patient size and/or use of iterative reconstruction technique. CONTRAST:  17mL OMNIPAQUE IOHEXOL 300 MG/ML  SOLN COMPARISON:  12/20/2021 renal ultrasound.  No comparison CT. FINDINGS: Lower chest: Clear lung bases. Normal heart size without pericardial or pleural effusion. Mild pectus excavatum deformity. Hepatobiliary: Normal liver. Normal gallbladder, without biliary ductal dilatation. Pancreas: Normal, without mass or ductal dilatation. Spleen: Normal in size, without focal abnormality. Adrenals/Urinary Tract: Normal adrenal glands. Bilateral too small to characterize renal lesions which are well-circumscribed. Interpolar right renal lesion measures 6 mm and greater than fluid density on 17/7 and 27/2. No hydronephrosis. Normal urinary bladder. Stomach/Bowel: The gastric body and antrum are diffusely thick walled with mucosal hyperenhancement, including on 34/2 and coronal image 46. Gas tracks along the periphery of the superior  aspect of the gastric antrum including on 32/2 and 42/4. Normal colon, appendix, and terminal ileum. Normal small bowel. Vascular/Lymphatic: Aortic atherosclerosis. No abdominopelvic adenopathy. Reproductive: Normal uterus and adnexa. Other: No significant free fluid.  Mild pelvic floor laxity. 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 IMPRESSION: 1. Moderate gastric body and antral wall thickening with mucosal hyperenhancement, consistent with gastritis. Gas tracking towards the cephalad aspect of the gastric antrum could represent an ulcer or even localized perforation. No free perforation identified. 2. No other explanation for abdominal pain. 3. Too small to characterize renal lesions. Interpolar right renal 6 mm lesion demonstrates complexity. Consider re-evaluation with pre and post contrast abdominal MRI at 12 months. 4.  Aortic Atherosclerosis (ICD10-I70.0). Electronically Signed   By: Abigail Miyamoto M.D.   On: 02/09/2022 17:45    Procedures Procedures    Medications  Ordered in ED Medications  metroNIDAZOLE (FLAGYL) IVPB 500 mg (has no administration in time range)  pantoprozole (PROTONIX) 80 mg /NS 100 mL infusion (has no administration in time range)  pantoprazole (PROTONIX) injection 40 mg (has no administration in time range)  acetaminophen (TYLENOL) tablet 650 mg (has no administration in time range)    Or  acetaminophen (TYLENOL) suppository 650 mg (has no administration in time range)  naloxone (NARCAN) injection 0.4 mg (has no administration in time range)  fentaNYL (SUBLIMAZE) injection 25 mcg (has no administration in time range)  ondansetron (ZOFRAN) injection 4 mg (has no administration in time range)  lactated ringers infusion (has no administration in time range)  cefTRIAXone (ROCEPHIN) 2 g in sodium chloride 0.9 % 100 mL IVPB (has no administration in time range)  cefTRIAXone (ROCEPHIN) 2 g in sodium chloride 0.9 % 100 mL IVPB (2 g Intravenous New  Bag/Given 02/09/22 1958)  triamcinolone (NASACORT) nasal inhaler 2 spray (has no administration in time range)  morphine (PF) 2 MG/ML injection 2 mg (2 mg Intravenous Given 02/09/22 1711)  ondansetron (ZOFRAN) injection 4 mg (4 mg Intravenous Given 02/09/22 1709)  iohexol (OMNIPAQUE) 300 MG/ML solution 100 mL (100 mLs Intravenous Contrast Given 02/09/22 1731)  pantoprazole (PROTONIX) injection 40 mg (40 mg Intravenous Given 02/09/22 1839)    ED Course/ Medical Decision Making/ A&P Clinical Course as of 02/09/22 2004  Sun Feb 09, 2022  1849 Consult GI / gastro, protonix drip, empiric antibiotics [CP]    Clinical Course User Index [CP] Anselmo Pickler, PA-C                           Medical Decision Making Amount and/or Complexity of Data Reviewed Labs: ordered. Radiology: ordered.  Risk Prescription drug management. Decision regarding hospitalization.   This patient is a 56 y.o. female who presents to the ED for concern of abdominal pain, vomiting, questionable hematemesis, this involves an extensive number of treatment options, and is a complaint that carries with it a high risk of complications and morbidity. The emergent differential diagnosis prior to evaluation includes, but is not limited to,  peptic ulcer, gastritis, acute mesenteric ischemia, or acute, or surgical concerns versus other.   This is not an exhaustive differential.   Past Medical History / Co-morbidities / Social History: Previous history of peptic ulcer disease remotely  Additional history: Chart reviewed. Pertinent results include: Reviewed endoscopy note from 2019, patient had seen Dr. Rush Landmark at that time  Physical Exam: Physical exam performed. The pertinent findings include: Patient with tenderness palpation in the epigastric region, left lower quadrant region and throughout the abdomen.  Do not note any rebound, rigidity, guarding at this time, however patient's abdominal pain is fairly diffuse.  She  did endorse some dyspareunia.  My pelvic exam did not reveal any significant abnormality of the cervix, CMT, or adnexal tenderness.  She seems to have some irritation at the vaginal introitus, I have high clinical suspicion for atrophic vaginitis.  Lab Tests: I ordered, and personally interpreted labs.  The pertinent results include: Patient with mild leukocytosis, white blood cells 12.2.  She does have a minimal anemia with hemoglobin 11.9, however no clinically significant acute anemia from baseline to suggest aggressive GI bleed at this time.  She is not having gross hematemesis.  Her CMP is overall unremarkable other than mild hyperglycemia glucose 173.  Her BUN is mildly elevated at 32 with normal creatinine.  UA is unremarkable.  Wet prep is unremarkable.   Imaging Studies: I ordered imaging studies including CT abdomen pelvis with contrast. I independently visualized and interpreted imaging which showed gastric antrum thickening, questionable gas suggestive of peptic ulcer versus possible early perforation. I agree with the radiologist interpretation.   Medications: I ordered medication including Rocephin, Flagyl for possible perforation and intra-abdominal infection, Protonix bolus and infusion for peptic ulcer disease/possible early perforation, morphine for pain, Zofran for nausea. Reevaluation of the patient after these medicines showed that the patient with significant improvement of her pain.  She will need reevaluation for any ongoing GI bleeding, or resolution of her abdominal symptoms during the course of her admission, likely will need an endoscopy.  Consultations Obtained: I requested consultation with the general surgeon, spoke with Michaelle Birks who agrees with admission, Protonix bolus and infusion, and will consult.  Consulted [facility] team to round on this patient.  Finally spoke with Dr. Eugenia Pancoast from the hospitalist team,  and discussed lab and imaging findings as well as  pertinent plan - they recommend: Admission for peptic ulcer disease, possible early perforation  I discussed this case with my attending physician Dr. Billy Fischer who cosigned this note including patient's presenting symptoms, physical exam, and planned diagnostics and interventions. Attending physician stated agreement with plan or made changes to plan which were implemented.    Final Clinical Impression(s) / ED Diagnoses Final diagnoses:  None    Rx / DC Orders ED Discharge Orders     None         Dorien Chihuahua 02/09/22 2004    Schlossman, Erin, MD 02/10/22 2202

## 2022-02-09 NOTE — ED Triage Notes (Signed)
Patient c/o LLQ abdominal cramping and emesis x 2 hours. Patient states "at times the pain radiates into both sides of my neck,"  Patient states her emesis is dark [place] with a red tint.

## 2022-02-10 ENCOUNTER — Encounter (HOSPITAL_COMMUNITY): Payer: Self-pay | Admitting: Internal Medicine

## 2022-02-10 DIAGNOSIS — D508 Other iron deficiency anemias: Secondary | ICD-10-CM | POA: Diagnosis not present

## 2022-02-10 DIAGNOSIS — R933 Abnormal findings on diagnostic imaging of other parts of digestive tract: Secondary | ICD-10-CM | POA: Diagnosis not present

## 2022-02-10 DIAGNOSIS — N19 Unspecified kidney failure: Secondary | ICD-10-CM | POA: Diagnosis not present

## 2022-02-10 DIAGNOSIS — R1013 Epigastric pain: Secondary | ICD-10-CM | POA: Diagnosis not present

## 2022-02-10 DIAGNOSIS — D649 Anemia, unspecified: Secondary | ICD-10-CM | POA: Diagnosis not present

## 2022-02-10 LAB — CBC WITH DIFFERENTIAL/PLATELET
Abs Immature Granulocytes: 0.03 10*3/uL (ref 0.00–0.07)
Basophils Absolute: 0.1 10*3/uL (ref 0.0–0.1)
Basophils Relative: 0 %
Eosinophils Absolute: 0 10*3/uL (ref 0.0–0.5)
Eosinophils Relative: 0 %
HCT: 30.3 % — ABNORMAL LOW (ref 36.0–46.0)
Hemoglobin: 9.9 g/dL — ABNORMAL LOW (ref 12.0–15.0)
Immature Granulocytes: 0 %
Lymphocytes Relative: 12 %
Lymphs Abs: 1.3 10*3/uL (ref 0.7–4.0)
MCH: 33.4 pg (ref 26.0–34.0)
MCHC: 32.7 g/dL (ref 30.0–36.0)
MCV: 102.4 fL — ABNORMAL HIGH (ref 80.0–100.0)
Monocytes Absolute: 0.8 10*3/uL (ref 0.1–1.0)
Monocytes Relative: 7 %
Neutro Abs: 9.3 10*3/uL — ABNORMAL HIGH (ref 1.7–7.7)
Neutrophils Relative %: 81 %
Platelets: 158 10*3/uL (ref 150–400)
RBC: 2.96 MIL/uL — ABNORMAL LOW (ref 3.87–5.11)
RDW: 12.7 % (ref 11.5–15.5)
WBC: 11.5 10*3/uL — ABNORMAL HIGH (ref 4.0–10.5)
nRBC: 0 % (ref 0.0–0.2)

## 2022-02-10 LAB — COMPREHENSIVE METABOLIC PANEL
ALT: 15 U/L (ref 0–44)
AST: 17 U/L (ref 15–41)
Albumin: 3.5 g/dL (ref 3.5–5.0)
Alkaline Phosphatase: 45 U/L (ref 38–126)
Anion gap: 5 (ref 5–15)
BUN: 32 mg/dL — ABNORMAL HIGH (ref 6–20)
CO2: 29 mmol/L (ref 22–32)
Calcium: 8.8 mg/dL — ABNORMAL LOW (ref 8.9–10.3)
Chloride: 108 mmol/L (ref 98–111)
Creatinine, Ser: 0.9 mg/dL (ref 0.44–1.00)
GFR, Estimated: >60 mL/min (ref >60)
Glucose, Bld: 132 mg/dL — ABNORMAL HIGH (ref 70–99)
Potassium: 3.7 mmol/L (ref 3.5–5.1)
Sodium: 142 mmol/L (ref 135–145)
Total Bilirubin: 1.2 mg/dL (ref 0.3–1.2)
Total Protein: 6.1 g/dL — ABNORMAL LOW (ref 6.5–8.1)

## 2022-02-10 LAB — PROTIME-INR
INR: 1.3 — ABNORMAL HIGH (ref 0.8–1.2)
Prothrombin Time: 15.7 seconds — ABNORMAL HIGH (ref 11.4–15.2)

## 2022-02-10 LAB — FOLATE: Folate: 20.8 ng/mL (ref >5.9)

## 2022-02-10 LAB — IRON AND TIBC
Iron: 19 ug/dL — ABNORMAL LOW (ref 28–170)
Saturation Ratios: 10 % — ABNORMAL LOW (ref 10.4–31.8)
TIBC: 199 ug/dL — ABNORMAL LOW (ref 250–450)
UIBC: 180 ug/dL

## 2022-02-10 LAB — ABO/RH: ABO/RH(D): O NEG

## 2022-02-10 LAB — HEMOGLOBIN AND HEMATOCRIT, BLOOD
HCT: 33.5 % — ABNORMAL LOW (ref 36.0–46.0)
HCT: 28.4 % — ABNORMAL LOW (ref 36.0–46.0)
Hemoglobin: 10.9 g/dL — ABNORMAL LOW (ref 12.0–15.0)
Hemoglobin: 9.4 g/dL — ABNORMAL LOW (ref 12.0–15.0)

## 2022-02-10 LAB — GC/CHLAMYDIA PROBE AMP (~~LOC~~) NOT AT ARMC
Chlamydia: NEGATIVE
Comment: NEGATIVE
Comment: NORMAL
Neisseria Gonorrhea: NEGATIVE

## 2022-02-10 LAB — VITAMIN B12: Vitamin B-12: 214 pg/mL (ref 180–914)

## 2022-02-10 LAB — MAGNESIUM: Magnesium: 1.8 mg/dL (ref 1.7–2.4)

## 2022-02-10 LAB — TYPE AND SCREEN
ABO/RH(D): O NEG
Antibody Screen: NEGATIVE

## 2022-02-10 LAB — FERRITIN: Ferritin: 119 ng/mL (ref 11–307)

## 2022-02-10 MED ORDER — OXYCODONE HCL 5 MG PO TABS
5.0000 mg | ORAL_TABLET | Freq: Four times a day (QID) | ORAL | Status: DC | PRN
  Administered 2022-02-10 – 2022-02-11 (×2): 5 mg via ORAL
  Filled 2022-02-10 (×2): qty 1

## 2022-02-10 NOTE — Consult Note (Addendum)
Consultation Note   Referring Provider: [facility] PCP: Glenis Smoker, MD Primary Gastroenterologist: Justice Britain, MD Reason for consultation: possible gastric ulcer  Hospital Day: 2  Assessment    56 yo female admitted with nausea, vomiting, dark emesis, mid to lower abdominal pain, and CT scan showing gastric and antral wall thickening with mucosal enhancement and possible localized perforation.  Rule out PUD. Takes NSAIDs but only sparingly  Chronic macrocytic anemia. baseline hgb in 11.4 range, currently down to 9.4 but has received IVF.  Cannot exclude upper GI bleed.with elevated BUN and decline in hgb.  Her stools are dark on iron  No polyps on screening colonoscopy in 2019  Small renal lesions, too small to be characterized on CT scan   See PMH for additional medical problems   Plan   Getting Rocephin and flagyl General Surgery is following Getting Protonix infusion Obtain iron studies keeping in mind that she is on oral iron. Check b12, folate given macrocytosis Will plan for EGD tomorrow. Schedule for EGD. The risks and benefits of EGD with possible biopsies were discussed with the patient who agrees to proceed.   HPI   Paula Ramos is a 56 y.o. female with a past medical history significant for   See PMH for any additional medical problems.   Patient presented to [facility] ED 6/4 for evaluation of abdominal pain and vomiting. The abdominal pain was periumbilical down to mid lower abdomen. The pain was stabbing unlike any pain she has ever had before. She had associated nausea / vomiting. Describes several episodes of dark emesis on day of admission. She doesn't feel like the abdominal pain was related to constipation. Though she takes oral iron she does not struggle with constipation. No urinary symptoms. She endorses dyspareunia. Her stools are usually dark on iron. She very occasionally takes  Motrin, no other NSAIDS. In 2018 she was seen in outside ED for nausea / vomiting and told she may have an ulcer.   In ED her WBC was mildly elevated at 12.2. Hgb 11.9 ( at baseline).  CT scan >> thickening of gastric body and antrum and possible contained perforation. General Surgery evaluated, recommends EGD.   Today wbc is 11.5. Hgb 9.4 ( after IVF). MCV 102. BUN 32. .   Patient gives a history of anemia dating back to February. She takes iron QOD.     Previous GI Evaluation    Nov 2019 Screening colonoscopy  -Perianal skin tags found on perianal exam. - Non-thrombosed internal hemorrhoids found on digital rectal exam. - Tortuous colon. - The examined portion of the ileum was normal. - Normal mucosa in the entire examined colon. - Anal papilla(e) were hypertrophied. - Non-bleeding non-thrombosed internal hemorrhoids.  Recent Labs and Imaging CT ABDOMEN PELVIS W CONTRAST  Result Date: 02/09/2022 CLINICAL DATA:  Left lower quadrant pain.  Emesis. EXAM: CT ABDOMEN AND PELVIS WITH CONTRAST TECHNIQUE: Multidetector CT imaging of the abdomen and pelvis was performed using the standard protocol following bolus administration of intravenous contrast. RADIATION DOSE REDUCTION: This exam was performed according to the departmental dose-optimization program which includes automated exposure control, adjustment of the mA and/or kV according to patient size and/or use of iterative reconstruction technique. CONTRAST:  187mL OMNIPAQUE IOHEXOL 300 MG/ML  SOLN COMPARISON:  12/20/2021 renal ultrasound.  No comparison CT. FINDINGS: Lower chest: Clear lung bases. Normal heart size without pericardial or pleural effusion. Mild pectus excavatum deformity. Hepatobiliary: Normal liver. Normal gallbladder, without biliary ductal dilatation. Pancreas: Normal, without mass or ductal dilatation. Spleen: Normal in size, without focal abnormality. Adrenals/Urinary Tract: Normal adrenal glands. Bilateral too small to  characterize renal lesions which are well-circumscribed. Interpolar right renal lesion measures 6 mm and greater than fluid density on 17/7 and 27/2. No hydronephrosis. Normal urinary bladder. Stomach/Bowel: The gastric body and antrum are diffusely thick walled with mucosal hyperenhancement, including on 34/2 and coronal image 46. Gas tracks along the periphery of the superior aspect of the gastric antrum including on 32/2 and 42/4. Normal colon, appendix, and terminal ileum. Normal small bowel. Vascular/Lymphatic: Aortic atherosclerosis. No abdominopelvic adenopathy. Reproductive: Normal uterus and adnexa. Other: No significant free fluid.  Mild pelvic floor laxity. 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 IMPRESSION: 1. Moderate gastric body and antral wall thickening with mucosal hyperenhancement, consistent with gastritis. Gas tracking towards the cephalad aspect of the gastric antrum could represent an ulcer or even localized perforation. No free perforation identified. 2. No other explanation for abdominal pain. 3. Too small to characterize renal lesions. Interpolar right renal 6 mm lesion demonstrates complexity. Consider re-evaluation with pre and post contrast abdominal MRI at 12 months. 4.  Aortic Atherosclerosis (ICD10-I70.0). Electronically Signed   By: Abigail Miyamoto M.D.   On: 02/09/2022 17:45    Labs:  Recent Labs    02/09/22 1338 02/10/22 0100 02/10/22 0506 02/10/22 0847  WBC 12.2*  --  11.5*  --   HGB 11.9* 10.9* 9.9* 9.4*  HCT 35.5* 33.5* 30.3* 28.4*  PLT 208  --  158  --    Recent Labs    02/09/22 1338 02/10/22 0506  NA 143 142  K 4.0 3.7  CL 107 108  CO2 29 29  GLUCOSE 173* 132*  BUN 32* 32*  CREATININE 0.82 0.90  CALCIUM 9.3 8.8*   Recent Labs    02/10/22 0506  PROT 6.1*  ALBUMIN 3.5  AST 17  ALT 15  ALKPHOS 45  BILITOT 1.2   No results for input(s): HEPBSAG, HCVAB, HEPAIGM, HEPBIGM in the last 72 hours. Recent Labs     02/10/22 0506  LABPROT 15.7*  INR 1.3*    Past Medical History:  Diagnosis Date   Allergic rhinitis    Peptic ulcer disease     Past Surgical History:  Procedure Laterality Date   NO PAST SURGERIES      Family History  Problem Relation Age of Onset   Kidney disease Mother    Colon cancer Neg Hx    Esophageal cancer Neg Hx    Rectal cancer Neg Hx    Stomach cancer Neg Hx     Prior to Admission medications   Medication Sig Start Date End Date Taking? Authorizing Provider  Multiple Vitamin (MULTI-VITAMIN DAILY PO) Take 1 tablet by mouth daily.   Yes [provider]  cetirizine (ZYRTEC) 10 MG tablet Take 1 tablet (10 mg total) by mouth daily. Patient not taking: Reported on 07/20/2018 11/24/16   Forrest Moron, MD  tiZANidine (ZANAFLEX) 2 MG tablet Take 1-2 tablets (2-4 mg total) by mouth every 6 (six) hours as needed for muscle spasms. Patient not taking: Reported on 02/09/2022 04/22/21   Wieters, Hallie C, PA-C  triamcinolone (NASACORT) 55 MCG/ACT AERO nasal inhaler Place  2 sprays into the nose daily. Patient not taking: Reported on 02/09/2022 04/22/21   Janith Lima, PA-C    Current Facility-Administered Medications  Medication Dose Route Frequency Provider Last Rate Last Admin   acetaminophen (TYLENOL) tablet 650 mg  650 mg Oral Q6H PRN Howerter, Justin B, DO   650 mg at 02/10/22 1026   Or   acetaminophen (TYLENOL) suppository 650 mg  650 mg Rectal Q6H PRN Howerter, Justin B, DO       cefTRIAXone (ROCEPHIN) 2 g in sodium chloride 0.9 % 100 mL IVPB  2 g Intravenous Q24H Howerter, Justin B, DO 200 mL/hr at 02/10/22 1016 2 g at 02/10/22 1016   fentaNYL (SUBLIMAZE) injection 25 mcg  25 mcg Intravenous Q2H PRN Howerter, Justin B, DO       lactated ringers infusion   Intravenous Continuous Howerter, Justin B, DO 100 mL/hr at 02/10/22 0500 Infusion Verify at 02/10/22 0500   metroNIDAZOLE (FLAGYL) IVPB 500 mg  500 mg Intravenous Q12H Prosperi, Christian H, PA-C 100 mL/hr  at 02/10/22 0624 500 mg at 02/10/22 0624   naloxone (NARCAN) injection 0.4 mg  0.4 mg Intravenous PRN Howerter, Justin B, DO       ondansetron (ZOFRAN) injection 4 mg  4 mg Intravenous Q6H PRN Howerter, Justin B, DO       [START ON 02/13/2022] pantoprazole (PROTONIX) injection 40 mg  40 mg Intravenous Q12H Prosperi, Christian H, PA-C       pantoprozole (PROTONIX) 80 mg /NS 100 mL infusion  8 mg/hr Intravenous Continuous Prosperi, Christian H, PA-C 10 mL/hr at 02/10/22 [id number] 8 mg/hr at 02/10/22 [id number]    Allergies as of 02/09/2022   (No Known Allergies)    Social History   Socioeconomic History   Marital status: Widowed    Spouse name: Not on file   Number of children: Not on file   Years of education: Not on file   Highest education level: Not on file  Occupational History   Not on file  Tobacco Use   Smoking status: Never   Smokeless tobacco: Never  Vaping Use   Vaping Use: Never used  Substance and Sexual Activity   Alcohol use: Yes    Alcohol/week: 2.0 standard drinks    Types: 2 Cans of beer per week   Drug use: No   Sexual activity: Not on file  Other Topics Concern   Not on file  Social History Narrative   Not on file   Social Determinants of Health   Financial Resource Strain: Not on file  Food Insecurity: Not on file  Transportation Needs: Not on file  Physical Activity: Not on file  Stress: Not on file  Social Connections: Not on file  Intimate Partner Violence: Not on file    Review of Systems: Positive for dyspareumiaAll systems reviewed and negative except where noted in HPI.  Physical Exam: Vital signs in last 24 hours: Temp:  [97.6 F (36.4 C)-98.9 F (37.2 C)] 98.9 F (37.2 C) (06/05 0942) Pulse Rate:  [55-85] 61 (06/05 0942) Resp:  [12-20] 18 (06/05 0942) BP: (103-144)/(48-96) 103/53 (06/05 0942) SpO2:  [95 %-100 %] 98 % (06/05 0942) Weight:  [39.2 kg-39.5 kg] 39.2 kg (06/05 0500) Last BM Date : 02/07/22 (per patient)  General:  Alert thin  female in NAD Psych:  Pleasant, cooperative. Normal mood and affect Eyes: Pupils equal, no icterus. Conjunctive pink Ears:  Normal auditory acuity Nose: No deformity, discharge or lesions Neck:  Supple, no masses  felt Lungs:  Clear to auscultation.  Heart:  Regular rate, regular rhythm. No lower extremity edema Abdomen:  Soft, nondistended, localized periumbilical tenderness. Active bowel sounds, no masses felt Rectal :  Deferred Msk: Symmetrical without gross deformities.  Neurologic:  Alert, oriented, grossly normal neurologically Skin:  Intact without significant lesions.    Intake/Output from previous day: 06/04 0701 - 06/05 0700 In: 662.7 [I.V.:462.6; IV Piggyback:200.1] Out: -  Intake/Output this shift:  No intake/output data recorded.    Principal Problem:   Epigastric pain Active Problems:   Allergic rhinitis   Acute prerenal azotemia   Leukocytosis   Chronic anemia    Paula Savoy, NP-C @  02/10/2022, 10:54 AM  I have taken an interval history, thoroughly reviewed the chart and examined the patient. I agree with the Advanced Practitioner's note, impression and recommendations, and have recorded additional findings, impressions and recommendations below. I performed a substantive portion of this encounter (>50% time spent), including a complete performance of the medical decision making.  My additional thoughts are as follows:  Admitted for recently escalating upper abdominal pain and imaging suggestive of a significant gastric ulcer with possible contained perforation.  She was seen in general surgery consult who did not feel there was need for emergent surgery.  She has upper abdominal tenderness without surgical abdomen.  Last seen at our office by Dr. Rush Landmark for screening colonoscopy in 2019, which happened to occur shortly after an ED visit when she was reportedly seen for a few days of upper abdominal pain.  It is from that ED visit that she believed she  may have had an ulcer, though had was a presumptive diagnosis.  At this point, mixed picture anemia and imaging warrants upper endoscopy.  She is on antibiotics and Protonix drip and n.p.o.  She was agreeable to an upper endoscopy after discussion of the procedure and risks, and this will most likely be with Dr. Dustin Flock tomorrow.  The benefits and risks of the planned procedure were described in detail with the patient or (when appropriate) their health care proxy.  Risks were outlined as including, but not limited to, bleeding, infection, perforation, adverse medication reaction leading to cardiac or pulmonary decompensation, pancreatitis (if ERCP).  The limitation of incomplete mucosal visualization was also discussed.  No guarantees or warranties were given.  N.p.o. in the meantime, further recommendations to follow EGD findings.   Nelida Meuse III Office:[phone]

## 2022-02-10 NOTE — H&P (View-Only) (Signed)
                                             Consultation Note   Referring Provider: [facility] PCP: Timberlake, Kathryn S, MD Primary Gastroenterologist: Gabriel Mansouraty, MD Reason for consultation: possible gastric ulcer  Hospital Day: 2  Assessment    56 yo female admitted with nausea, vomiting, dark emesis, mid to lower abdominal pain, and CT scan showing gastric and antral wall thickening with mucosal enhancement and possible localized perforation.  Rule out PUD. Takes NSAIDs but only sparingly  Chronic macrocytic anemia. baseline hgb in 11.4 range, currently down to 9.4 but has received IVF.  Cannot exclude upper GI bleed.with elevated BUN and decline in hgb.  Her stools are dark on iron  No polyps on screening colonoscopy in 2019  Small renal lesions, too small to be characterized on CT scan   See PMH for additional medical problems   Plan   Getting Rocephin and flagyl General Surgery is following Getting Protonix infusion Obtain iron studies keeping in mind that she is on oral iron. Check b12, folate given macrocytosis Will plan for EGD tomorrow. Schedule for EGD. The risks and benefits of EGD with possible biopsies were discussed with the patient who agrees to proceed.   HPI   Paula Ramos is a 56 y.o. female with a past medical history significant for   See PMH for any additional medical problems.   Patient presented to [place] ED 6/4 for evaluation of abdominal pain and vomiting. The abdominal pain was periumbilical down to mid lower abdomen. The pain was stabbing unlike any pain she has ever had before. She had associated nausea / vomiting. Describes several episodes of dark emesis on day of admission. She doesn't feel like the abdominal pain was related to constipation. Though she takes oral iron she does not struggle with constipation. No urinary symptoms. She endorses dyspareunia. Her stools are usually dark on iron. She very occasionally takes  Motrin, no other NSAIDS. In 2018 she was seen in outside ED for nausea / vomiting and told she may have an ulcer.   In ED her WBC was mildly elevated at 12.2. Hgb 11.9 ( at baseline).  CT scan >> thickening of gastric body and antrum and possible contained perforation. General Surgery evaluated, recommends EGD.   Today wbc is 11.5. Hgb 9.4 ( after IVF). MCV 102. BUN 32. .   Patient gives a history of anemia dating back to February. She takes iron QOD.     Previous GI Evaluation    Nov 2019 Screening colonoscopy  -Perianal skin tags found on perianal exam. - Non-thrombosed internal hemorrhoids found on digital rectal exam. - Tortuous colon. - The examined portion of the ileum was normal. - Normal mucosa in the entire examined colon. - Anal papilla(e) were hypertrophied. - Non-bleeding non-thrombosed internal hemorrhoids.  Recent Labs and Imaging CT ABDOMEN PELVIS W CONTRAST  Result Date: 02/09/2022 CLINICAL DATA:  Left lower quadrant pain.  Emesis. EXAM: CT ABDOMEN AND PELVIS WITH CONTRAST TECHNIQUE: Multidetector CT imaging of the abdomen and pelvis was performed using the standard protocol following bolus administration of intravenous contrast. RADIATION DOSE REDUCTION: This exam was performed according to the departmental dose-optimization program which includes automated exposure control, adjustment of the mA and/or kV according to patient size and/or use of iterative reconstruction technique. CONTRAST:    100mL OMNIPAQUE IOHEXOL 300 MG/ML  SOLN COMPARISON:  12/20/2021 renal ultrasound.  No comparison CT. FINDINGS: Lower chest: Clear lung bases. Normal heart size without pericardial or pleural effusion. Mild pectus excavatum deformity. Hepatobiliary: Normal liver. Normal gallbladder, without biliary ductal dilatation. Pancreas: Normal, without mass or ductal dilatation. Spleen: Normal in size, without focal abnormality. Adrenals/Urinary Tract: Normal adrenal glands. Bilateral too small to  characterize renal lesions which are well-circumscribed. Interpolar right renal lesion measures 6 mm and greater than fluid density on 17/7 and 27/2. No hydronephrosis. Normal urinary bladder. Stomach/Bowel: The gastric body and antrum are diffusely thick walled with mucosal hyperenhancement, including on 34/2 and coronal image 46. Gas tracks along the periphery of the superior aspect of the gastric antrum including on 32/2 and 42/4. Normal colon, appendix, and terminal ileum. Normal small bowel. Vascular/Lymphatic: Aortic atherosclerosis. No abdominopelvic adenopathy. Reproductive: Normal uterus and adnexa. Other: No significant free fluid.  Mild pelvic floor laxity. 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 IMPRESSION: 1. Moderate gastric body and antral wall thickening with mucosal hyperenhancement, consistent with gastritis. Gas tracking towards the cephalad aspect of the gastric antrum could represent an ulcer or even localized perforation. No free perforation identified. 2. No other explanation for abdominal pain. 3. Too small to characterize renal lesions. Interpolar right renal 6 mm lesion demonstrates complexity. Consider re-evaluation with pre and post contrast abdominal MRI at 12 months. 4.  Aortic Atherosclerosis (ICD10-I70.0). Electronically Signed   By: Kyle  Talbot M.D.   On: 02/09/2022 17:45    Labs:  Recent Labs    02/09/22 1338 02/10/22 0100 02/10/22 0506 02/10/22 0847  WBC 12.2*  --  11.5*  --   HGB 11.9* 10.9* 9.9* 9.4*  HCT 35.5* 33.5* 30.3* 28.4*  PLT 208  --  158  --    Recent Labs    02/09/22 1338 02/10/22 0506  NA 143 142  K 4.0 3.7  CL 107 108  CO2 29 29  GLUCOSE 173* 132*  BUN 32* 32*  CREATININE 0.82 0.90  CALCIUM 9.3 8.8*   Recent Labs    02/10/22 0506  PROT 6.1*  ALBUMIN 3.5  AST 17  ALT 15  ALKPHOS 45  BILITOT 1.2   No results for input(s): HEPBSAG, HCVAB, HEPAIGM, HEPBIGM in the last 72 hours. Recent Labs     02/10/22 0506  LABPROT 15.7*  INR 1.3*    Past Medical History:  Diagnosis Date   Allergic rhinitis    Peptic ulcer disease     Past Surgical History:  Procedure Laterality Date   NO PAST SURGERIES      Family History  Problem Relation Age of Onset   Kidney disease Mother    Colon cancer Neg Hx    Esophageal cancer Neg Hx    Rectal cancer Neg Hx    Stomach cancer Neg Hx     Prior to Admission medications   Medication Sig Start Date End Date Taking? Authorizing Provider  Multiple Vitamin (MULTI-VITAMIN DAILY PO) Take 1 tablet by mouth daily.   Yes [provider]  cetirizine (ZYRTEC) 10 MG tablet Take 1 tablet (10 mg total) by mouth daily. Patient not taking: Reported on 07/20/2018 11/24/16   Stallings, Zoe A, MD  tiZANidine (ZANAFLEX) 2 MG tablet Take 1-2 tablets (2-4 mg total) by mouth every 6 (six) hours as needed for muscle spasms. Patient not taking: Reported on 02/09/2022 04/22/21   Wieters, Hallie C, PA-C  triamcinolone (NASACORT) 55 MCG/ACT AERO nasal inhaler Place   2 sprays into the nose daily. Patient not taking: Reported on 02/09/2022 04/22/21   Wieters, Hallie C, PA-C    Current Facility-Administered Medications  Medication Dose Route Frequency Provider Last Rate Last Admin   acetaminophen (TYLENOL) tablet 650 mg  650 mg Oral Q6H PRN Howerter, Justin B, DO   650 mg at 02/10/22 1026   Or   acetaminophen (TYLENOL) suppository 650 mg  650 mg Rectal Q6H PRN Howerter, Justin B, DO       cefTRIAXone (ROCEPHIN) 2 g in sodium chloride 0.9 % 100 mL IVPB  2 g Intravenous Q24H Howerter, Justin B, DO 200 mL/hr at 02/10/22 1016 2 g at 02/10/22 1016   fentaNYL (SUBLIMAZE) injection 25 mcg  25 mcg Intravenous Q2H PRN Howerter, Justin B, DO       lactated ringers infusion   Intravenous Continuous Howerter, Justin B, DO 100 mL/hr at 02/10/22 0500 Infusion Verify at 02/10/22 0500   metroNIDAZOLE (FLAGYL) IVPB 500 mg  500 mg Intravenous Q12H Prosperi, Christian H, PA-C 100 mL/hr  at 02/10/22 0624 500 mg at 02/10/22 0624   naloxone (NARCAN) injection 0.4 mg  0.4 mg Intravenous PRN Howerter, Justin B, DO       ondansetron (ZOFRAN) injection 4 mg  4 mg Intravenous Q6H PRN Howerter, Justin B, DO       [START ON 02/13/2022] pantoprazole (PROTONIX) injection 40 mg  40 mg Intravenous Q12H Prosperi, Christian H, PA-C       pantoprozole (PROTONIX) 80 mg /NS 100 mL infusion  8 mg/hr Intravenous Continuous Prosperi, Christian H, PA-C 10 mL/hr at 02/10/22 0621 8 mg/hr at 02/10/22 0621    Allergies as of 02/09/2022   (No Known Allergies)    Social History   Socioeconomic History   Marital status: Widowed    Spouse name: Not on file   Number of children: Not on file   Years of education: Not on file   Highest education level: Not on file  Occupational History   Not on file  Tobacco Use   Smoking status: Never   Smokeless tobacco: Never  Vaping Use   Vaping Use: Never used  Substance and Sexual Activity   Alcohol use: Yes    Alcohol/week: 2.0 standard drinks    Types: 2 Cans of beer per week   Drug use: No   Sexual activity: Not on file  Other Topics Concern   Not on file  Social History Narrative   Not on file   Social Determinants of Health   Financial Resource Strain: Not on file  Food Insecurity: Not on file  Transportation Needs: Not on file  Physical Activity: Not on file  Stress: Not on file  Social Connections: Not on file  Intimate Partner Violence: Not on file    Review of Systems: Positive for dyspareumiaAll systems reviewed and negative except where noted in HPI.  Physical Exam: Vital signs in last 24 hours: Temp:  [97.6 F (36.4 C)-98.9 F (37.2 C)] 98.9 F (37.2 C) (06/05 0942) Pulse Rate:  [55-85] 61 (06/05 0942) Resp:  [12-20] 18 (06/05 0942) BP: (103-144)/(48-96) 103/53 (06/05 0942) SpO2:  [95 %-100 %] 98 % (06/05 0942) Weight:  [39.2 kg-39.5 kg] 39.2 kg (06/05 0500) Last BM Date : 02/07/22 (per patient)  General:  Alert thin  female in NAD Psych:  Pleasant, cooperative. Normal mood and affect Eyes: Pupils equal, no icterus. Conjunctive pink Ears:  Normal auditory acuity Nose: No deformity, discharge or lesions Neck:  Supple, no masses   felt Lungs:  Clear to auscultation.  Heart:  Regular rate, regular rhythm. No lower extremity edema Abdomen:  Soft, nondistended, localized periumbilical tenderness. Active bowel sounds, no masses felt Rectal :  Deferred Msk: Symmetrical without gross deformities.  Neurologic:  Alert, oriented, grossly normal neurologically Skin:  Intact without significant lesions.    Intake/Output from previous day: 06/04 0701 - 06/05 0700 In: 662.7 [I.V.:462.6; IV Piggyback:200.1] Out: -  Intake/Output this shift:  No intake/output data recorded.    Principal Problem:   Epigastric pain Active Problems:   Allergic rhinitis   Acute prerenal azotemia   Leukocytosis   Chronic anemia    Paula Guenther, NP-C @  02/10/2022, 10:54 AM  I have taken an interval history, thoroughly reviewed the chart and examined the patient. I agree with the Advanced Practitioner's note, impression and recommendations, and have recorded additional findings, impressions and recommendations below. I performed a substantive portion of this encounter (>50% time spent), including a complete performance of the medical decision making.  My additional thoughts are as follows:  Admitted for recently escalating upper abdominal pain and imaging suggestive of a significant gastric ulcer with possible contained perforation.  She was seen in general surgery consult who did not feel there was need for emergent surgery.  She has upper abdominal tenderness without surgical abdomen.  Last seen at our office by Dr. Mansouraty for screening colonoscopy in 2019, which happened to occur shortly after an ED visit when she was reportedly seen for a few days of upper abdominal pain.  It is from that ED visit that she believed she  may have had an ulcer, though had was a presumptive diagnosis.  At this point, mixed picture anemia and imaging warrants upper endoscopy.  She is on antibiotics and Protonix drip and n.p.o.  She was agreeable to an upper endoscopy after discussion of the procedure and risks, and this will most likely be with Dr. Scott Cunningham tomorrow.  The benefits and risks of the planned procedure were described in detail with the patient or (when appropriate) their health care proxy.  Risks were outlined as including, but not limited to, bleeding, infection, perforation, adverse medication reaction leading to cardiac or pulmonary decompensation, pancreatitis (if ERCP).  The limitation of incomplete mucosal visualization was also discussed.  No guarantees or warranties were given.  N.p.o. in the meantime, further recommendations to follow EGD findings.   Mariyanna Mucha L Danis III Office:[phone]    

## 2022-02-10 NOTE — Hospital Course (Signed)
56 y.o. female with medical history significant for peptic ulcer disease, chronic anemia with baseline hemoglobin 11-12, allergic rhinitis, who is admitted to [facility] on 02/09/2022 with epigastric pain after presenting from home to [facility] ED complaining of such

## 2022-02-10 NOTE — Progress Notes (Signed)
[facility] Progress Note     Subjective: CC:  NAEO. Denies abd pain today. Reports intermittent bloating and nausea/vomiting at home that usually goes away on its own with bowel rest. Sometimes goes months-a year between episodes.   Objective: Vital signs in last 24 hours: Temp:  [97.6 F (36.4 C)-98.7 F (37.1 C)] 98 F (36.7 C) (06/05 0537) Pulse Rate:  [55-85] 72 (06/05 0537) Resp:  [12-20] 20 (06/05 0537) BP: (112-144)/(48-96) 112/48 (06/05 0537) SpO2:  [95 %-100 %] 100 % (06/05 0537) Weight:  [39.2 kg-39.5 kg] 39.2 kg (06/05 0500) Last BM Date : 02/07/22 (per patient)  Intake/Output from previous day: 06/04 0701 - 06/05 0700 In: 662.7 [I.V.:462.6; IV Piggyback:200.1] Out: -  Intake/Output this shift: No intake/output data recorded.  PE: Gen:  Alert, NAD, cooperative Card:  Regular rate and rhythm Pulm:  Normal effort, clear to auscultation bilaterally Abd: Soft, mild TTP epigastric region without peritonitis/rebound tenderness, +BS, no palpable organomegaly Skin: warm and dry, no rashes  Psych: A&Ox3   Lab Results:  Recent Labs    02/09/22 1338 02/10/22 0100 02/10/22 0506 02/10/22 0847  WBC 12.2*  --  11.5*  --   HGB 11.9*   < > 9.9* 9.4*  HCT 35.5*   < > 30.3* 28.4*  PLT 208  --  158  --    < > = values in this interval not displayed.   BMET Recent Labs    02/09/22 1338 02/10/22 0506  NA 143 142  K 4.0 3.7  CL 107 108  CO2 29 29  GLUCOSE 173* 132*  BUN 32* 32*  CREATININE 0.82 0.90  CALCIUM 9.3 8.8*   PT/INR Recent Labs    02/10/22 0506  LABPROT 15.7*  INR 1.3*   CMP     Component Value Date/Time   NA 142 02/10/2022 0506   NA 145 (H) 04/22/2021 1224   K 3.7 02/10/2022 0506   CL 108 02/10/2022 0506   CO2 29 02/10/2022 0506   GLUCOSE 132 (H) 02/10/2022 0506   BUN 32 (H) 02/10/2022 0506   BUN 15 04/22/2021 1224   CREATININE 0.90 02/10/2022 0506   CALCIUM 8.8 (L) 02/10/2022 0506   PROT 6.1 (L) 02/10/2022 0506   PROT  7.3 04/22/2021 1224   ALBUMIN 3.5 02/10/2022 0506   ALBUMIN 5.1 (H) 04/22/2021 1224   AST 17 02/10/2022 0506   ALT 15 02/10/2022 0506   ALKPHOS 45 02/10/2022 0506   BILITOT 1.2 02/10/2022 0506   BILITOT 0.3 04/22/2021 1224   GFRNONAA >60 02/10/2022 0506   GFRAA >60 06/07/2018 2151   Lipase     Component Value Date/Time   LIPASE 29 02/09/2022 1338       Studies/Results: CT ABDOMEN PELVIS W CONTRAST  Result Date: 02/09/2022 CLINICAL DATA:  Left lower quadrant pain.  Emesis. EXAM: CT ABDOMEN AND PELVIS WITH CONTRAST TECHNIQUE: Multidetector CT imaging of the abdomen and pelvis was performed using the standard protocol following bolus administration of intravenous contrast. RADIATION DOSE REDUCTION: This exam was performed according to the departmental dose-optimization program which includes automated exposure control, adjustment of the mA and/or kV according to patient size and/or use of iterative reconstruction technique. CONTRAST:  OMNIPAQUE IOHEXOL 300 MG/ML  SOLN COMPARISON:  12/20/2021 renal ultrasound.  No comparison CT. FINDINGS: Lower chest: Clear lung bases. Normal heart size without pericardial or pleural effusion. Mild pectus excavatum deformity. Hepatobiliary: Normal liver. Normal gallbladder, without biliary ductal dilatation. Pancreas: Normal, without mass or ductal dilatation.  Spleen: Normal in size, without focal abnormality. Adrenals/Urinary Tract: Normal adrenal glands. Bilateral too small to characterize renal lesions which are well-circumscribed. Interpolar right renal lesion measures 6 mm and greater than fluid density on 17/7 and 27/2. No hydronephrosis. Normal urinary bladder. Stomach/Bowel: The gastric body and antrum are diffusely thick walled with mucosal hyperenhancement, including on 34/2 and coronal image 46. Gas tracks along the periphery of the superior aspect of the gastric antrum including on 32/2 and 42/4. Normal colon, appendix, and terminal ileum. Normal  small bowel. Vascular/Lymphatic: Aortic atherosclerosis. No abdominopelvic adenopathy. Reproductive: Normal uterus and adnexa. Other: No significant free fluid.  Mild pelvic floor laxity. 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 IMPRESSION: 1. Moderate gastric body and antral wall thickening with mucosal hyperenhancement, consistent with gastritis. Gas tracking towards the cephalad aspect of the gastric antrum could represent an ulcer or even localized perforation. No free perforation identified. 2. No other explanation for abdominal pain. 3. Too small to characterize renal lesions. Interpolar right renal 6 mm lesion demonstrates complexity. Consider re-evaluation with pre and post contrast abdominal MRI at 12 months. 4.  Aortic Atherosclerosis (ICD10-I70.0). Electronically Signed   By: Jeronimo Greaves M.D.   On: 02/09/2022 17:45    Anti-infectives: Anti-infectives (From admission, onward)    Start     Dose/Rate Route Frequency Ordered Stop   02/10/22 1000  cefTRIAXone (ROCEPHIN) 2 g in sodium chloride 0.9 % 100 mL IVPB        2 g 200 mL/hr over 30 Minutes Intravenous Every 24 hours 02/09/22 1935     02/09/22 1945  cefTRIAXone (ROCEPHIN) 2 g in sodium chloride 0.9 % 100 mL IVPB        2 g 200 mL/hr over 30 Minutes Intravenous  Once 02/09/22 1941 02/09/22 2028   02/09/22 1900  cefTRIAXone (ROCEPHIN) 1 g in sodium chloride 0.9 % 100 mL IVPB  Status:  Discontinued        1 g 200 mL/hr over 30 Minutes Intravenous  Once 02/09/22 1850 02/09/22 1941   02/09/22 1900  metroNIDAZOLE (FLAGYL) IVPB 500 mg        500 mg 100 mL/hr over 60 Minutes Intravenous Every 12 hours 02/09/22 1850          Assessment/Plan  Acute abdominal pain w/ a history of chronic bloating and GERD. PMH PUD.  - CT shows thickening of the distal stomach consistent with gastritis. No pneumoperitoneum to suggest frank perforation.  - afebrile, VSS, WBC 11.5 - abd exam w/ mild epigastric pain but no  peritonitis - per patient she is scheduled for endoscopy today but I do not see her on the endo schedule. GI consult is pending. Appreciate GI eval and consideration of EGD. No emergent surgical needs today and no current plans for surgery, we will follow.  - PPI , carafate for suspected PUD  FEN: NPO, IVF ID: Rocephin, Flagyl VTE: SCD's, chemical VTE is held suspect due to possible procedure. Ok to resume from surgical perspective.  Foley: none Dispo: floor, TRH service    LOS: 1 day   I reviewed nursing notes, hospitalist notes, last 24 h vitals and pain scores, last 48 h intake and output, last 24 h labs and trends, and last 24 h imaging results.  Hosie Spangle, PA-C [facility] Please see Amion for pager number during day hours 7:00am-4:30pm

## 2022-02-10 NOTE — Progress Notes (Signed)
  Progress Note   Patient: Paula Ramos:[id number] DOB: 1965/11/20 DOA: 02/09/2022     1 DOS: the patient was seen and examined on 02/10/2022   Brief hospital course: 56 y.o. female with medical history significant for peptic ulcer disease, chronic anemia with baseline hemoglobin 11-12, allergic rhinitis, who is admitted to [facility] on 02/09/2022 with epigastric pain after presenting from home to [facility] ED complaining of such  Assessment and Plan: Epigastric pain -Hx known peptic ulcers -CT without acute intra-abdominal process noted -Seen by Gen Surgery. GI consulted with recs for EGD tomorrow -cont on protonix -continued on empiric rocephin and flagyl -Will make NPO after midnight  Chronic anemia -Hemodynamically stable -Repeat CBC in AM  Leukocytosis -Stable currently. Afebrile -F/u on CBC in AM  Subjective: Without complaints this AM  Physical Exam: Vitals:   02/10/22 0537 02/10/22 0942 02/10/22 0942 02/10/22 1405  BP: (!) 112/48 (!) 103/53  112/63  Pulse: 72 61  61  Resp: 20 18  18   Temp: 98 F (36.7 C)  98.9 F (37.2 C) 98.2 F (36.8 C)  TempSrc: Oral  Oral Oral  SpO2: 100% 98%  100%  Weight:      Height:       General exam: Awake, laying in bed, in nad Respiratory system: Normal respiratory effort, no wheezing Cardiovascular system: regular rate, s1, s2 Gastrointestinal system: Soft, nondistended, positive BS Central nervous system: CN2-12 grossly intact, strength intact Extremities: Perfused, no clubbing Skin: Normal skin turgor, no notable skin lesions seen Psychiatry: Mood normal // no visual hallucinations   Data Reviewed:  Labs reviewed: Cr 0.9, Hgb 9.4  Family Communication: Pt in room, family not at bedside  Disposition: Status is: Inpatient Remains inpatient appropriate because: Severity of illness  Planned Discharge Destination: Home    Author: , MD 02/10/2022 3:52 PM  For on call review [email or web address].

## 2022-02-10 NOTE — Anesthesia Preprocedure Evaluation (Signed)
Anesthesia Evaluation  Patient identified by MRN, date of birth, ID band Patient awake    Reviewed: Allergy & Precautions, NPO status , Patient's Chart, lab work & pertinent test results  Airway Mallampati: I  TM Distance: >3 FB Neck ROM: Full    Dental no notable dental hx. (+) Teeth Intact, Dental Advisory Given   Pulmonary neg pulmonary ROS,    Pulmonary exam normal breath sounds clear to auscultation       Cardiovascular Exercise Tolerance: Good Normal cardiovascular exam Rhythm:Regular Rate:Normal     Neuro/Psych negative neurological ROS     GI/Hepatic PUD,   Endo/Other    Renal/GU      Musculoskeletal   Abdominal   Peds  Hematology   Anesthesia Other Findings   Reproductive/Obstetrics                            Anesthesia Physical Anesthesia Plan  ASA: 3  Anesthesia Plan: MAC   Post-op Pain Management:    Induction:   PONV Risk Score and Plan: Treatment may vary due to age or medical condition  Airway Management Planned: Natural Airway and Nasal Cannula  Additional Equipment: None  Intra-op Plan:   Post-operative Plan:   Informed Consent: I have reviewed the patients History and Physical, chart, labs and discussed the procedure including the risks, benefits and alternatives for the proposed anesthesia with the patient or authorized representative who has indicated his/her understanding and acceptance.     Dental advisory given  Plan Discussed with:   Anesthesia Plan Comments: (EGD for epigatric pain and vomiting)       Anesthesia Quick Evaluation

## 2022-02-10 NOTE — TOC Progression Note (Signed)
Transition of Care [facility]) - Progression Note    Patient Details  Name: [name] MRN: [id number] Date of Birth: June 04, 1966  Transition of Care [facility]) CM/SW Contact  Geni Bers, RN Phone Number: 02/10/2022, 3:51 PM  Clinical Narrative:       Transition of Care (TOC) Screening Note   Patient Details  Name: [name] Date of Birth: 1966-09-04   Transition of Care [facility]) CM/SW Contact:    Geni Bers, RN Phone Number: 02/10/2022, 3:51 PM    Transition of Care Department [facility]) has reviewed patient and no TOC needs have been identified at this time. We will continue to monitor patient advancement through interdisciplinary progression rounds. If new patient transition needs arise, please place a TOC consult.        Expected Discharge Plan and Services                                                 Social Determinants of Health (SDOH) Interventions    Readmission Risk Interventions     View : No data to display.

## 2022-02-11 ENCOUNTER — Inpatient Hospital Stay (HOSPITAL_COMMUNITY): Payer: 59 | Admitting: Certified Registered Nurse Anesthetist

## 2022-02-11 ENCOUNTER — Encounter (HOSPITAL_COMMUNITY)
Admission: EM | Disposition: A | Discharge status: Home / Self Care | Payer: Self-pay | Source: Home / Self Care | Attending: Emergency Medicine

## 2022-02-11 DIAGNOSIS — K259 Gastric ulcer, unspecified as acute or chronic, without hemorrhage or perforation: Secondary | ICD-10-CM | POA: Diagnosis not present

## 2022-02-11 DIAGNOSIS — R1013 Epigastric pain: Secondary | ICD-10-CM | POA: Diagnosis not present

## 2022-02-11 DIAGNOSIS — R933 Abnormal findings on diagnostic imaging of other parts of digestive tract: Secondary | ICD-10-CM

## 2022-02-11 DIAGNOSIS — K2289 Other specified disease of esophagus: Secondary | ICD-10-CM | POA: Diagnosis not present

## 2022-02-11 DIAGNOSIS — K315 Obstruction of duodenum: Secondary | ICD-10-CM

## 2022-02-11 DIAGNOSIS — D649 Anemia, unspecified: Secondary | ICD-10-CM | POA: Diagnosis not present

## 2022-02-11 DIAGNOSIS — N19 Unspecified kidney failure: Secondary | ICD-10-CM | POA: Diagnosis not present

## 2022-02-11 HISTORY — PX: ESOPHAGOGASTRODUODENOSCOPY (EGD) WITH PROPOFOL: SHX5813

## 2022-02-11 HISTORY — PX: BIOPSY: SHX5522

## 2022-02-11 LAB — CBC
HCT: 30 % — ABNORMAL LOW (ref 36.0–46.0)
Hemoglobin: 9.4 g/dL — ABNORMAL LOW (ref 12.0–15.0)
MCH: 33.5 pg (ref 26.0–34.0)
MCHC: 31.3 g/dL (ref 30.0–36.0)
MCV: 106.8 fL — ABNORMAL HIGH (ref 80.0–100.0)
Platelets: 143 10*3/uL — ABNORMAL LOW (ref 150–400)
RBC: 2.81 MIL/uL — ABNORMAL LOW (ref 3.87–5.11)
RDW: 12.6 % (ref 11.5–15.5)
WBC: 8.5 10*3/uL (ref 4.0–10.5)
nRBC: 0 % (ref 0.0–0.2)

## 2022-02-11 LAB — COMPREHENSIVE METABOLIC PANEL
ALT: 13 U/L (ref 0–44)
AST: 17 U/L (ref 15–41)
Albumin: 3.3 g/dL — ABNORMAL LOW (ref 3.5–5.0)
Alkaline Phosphatase: 38 U/L (ref 38–126)
Anion gap: 5 (ref 5–15)
BUN: 19 mg/dL (ref 6–20)
CO2: 28 mmol/L (ref 22–32)
Calcium: 8.9 mg/dL (ref 8.9–10.3)
Chloride: 109 mmol/L (ref 98–111)
Creatinine, Ser: 0.8 mg/dL (ref 0.44–1.00)
GFR, Estimated: >60 mL/min (ref >60)
Glucose, Bld: 103 mg/dL — ABNORMAL HIGH (ref 70–99)
Potassium: 3.9 mmol/L (ref 3.5–5.1)
Sodium: 142 mmol/L (ref 135–145)
Total Bilirubin: 0.7 mg/dL (ref 0.3–1.2)
Total Protein: 5.8 g/dL — ABNORMAL LOW (ref 6.5–8.1)

## 2022-02-11 SURGERY — ESOPHAGOGASTRODUODENOSCOPY (EGD) WITH PROPOFOL
Anesthesia: Monitor Anesthesia Care

## 2022-02-11 MED ORDER — PROPOFOL 10 MG/ML IV BOLUS
INTRAVENOUS | Status: DC | PRN
  Administered 2022-02-11: 20 mg via INTRAVENOUS

## 2022-02-11 MED ORDER — LACTATED RINGERS IV SOLN: INTRAVENOUS | Status: DC

## 2022-02-11 MED ORDER — PROPOFOL 500 MG/50ML IV EMUL
INTRAVENOUS | Status: DC | PRN
  Administered 2022-02-11: 100 ug/kg/min via INTRAVENOUS

## 2022-02-11 MED ORDER — PROPOFOL 500 MG/50ML IV EMUL
INTRAVENOUS | Status: AC
  Filled 2022-02-11: qty 150

## 2022-02-11 MED ORDER — PANTOPRAZOLE SODIUM 40 MG PO TBEC
40.0000 mg | DELAYED_RELEASE_TABLET | Freq: Two times a day (BID) | ORAL | Status: DC
  Administered 2022-02-11 – 2022-02-12 (×2): 40 mg via ORAL
  Filled 2022-02-11 (×2): qty 1

## 2022-02-11 MED ORDER — LIDOCAINE 2% (20 MG/ML) 5 ML SYRINGE
INTRAMUSCULAR | Status: DC | PRN
  Administered 2022-02-11: 40 mg via INTRAVENOUS

## 2022-02-11 MED ORDER — PANTOPRAZOLE SODIUM 40 MG PO TBEC: 40.0000 mg | DELAYED_RELEASE_TABLET | Freq: Two times a day (BID) | ORAL | Status: DC

## 2022-02-11 SURGICAL SUPPLY — 15 items

## 2022-02-11 NOTE — Anesthesia Procedure Notes (Addendum)
Procedure Name: MAC Date/Time: 02/11/2022 1:58 PM Performed by: West Pugh, CRNA Pre-anesthesia Checklist: Patient identified, Emergency Drugs available, Suction available, Patient being monitored and Timeout performed Patient Re-evaluated:Patient Re-evaluated prior to induction Oxygen Delivery Method: Simple face mask Preoxygenation: Pre-oxygenation with 100% oxygen Induction Type: IV induction Placement Confirmation: positive ETCO2 Dental Injury: Teeth and Oropharynx as per pre-operative assessment

## 2022-02-11 NOTE — Op Note (Signed)
[facility] Patient Name: Paula Ramos Procedure Date: 02/11/2022 MRN: [id number] Attending MD: Gladstone Pih. Candis Schatz , MD Date of Birth: [birth year] CSN: [id number] Age: 56 Admit Type: Inpatient Procedure:                Upper GI endoscopy Indications:              Lower abdominal pain, Abnormal CT of the GI tract,                            Nausea with vomiting Providers:                Nicki Reaper E. Candis Schatz, MD, Mikey College, RN, Cherylynn Ridges, Technician, Christell Faith, CRNA Referring MD:              Medicines:                Monitored Anesthesia Care Complications:            No immediate complications. Estimated Blood Loss:     Estimated blood loss was minimal. Procedure:                Pre-Anesthesia Assessment:                           - Prior to the procedure, a History and Physical                            was performed, and patient medications and                            allergies were reviewed. The patient's tolerance of                            previous anesthesia was also reviewed. The risks                            and benefits of the procedure and the sedation                            options and risks were discussed with the patient.                            All questions were answered, and informed consent                            was obtained. Prior Anticoagulants: The patient has                            taken no previous anticoagulant or antiplatelet                            agents. ASA Grade Assessment: II - A patient with  mild systemic disease. After reviewing the risks                            and benefits, the patient was deemed in                            satisfactory condition to undergo the procedure.                           After obtaining informed consent, the endoscope was                            passed under direct vision. Throughout the                             procedure, the patient's blood pressure, pulse, and                            oxygen saturations were monitored continuously. The                            [id number] Olympus endoscope was introduced                            through the mouth, and advanced to the third part                            of duodenum. The upper GI endoscopy was                            accomplished without difficulty. The patient                            tolerated the procedure well. Scope In: Scope Out: Findings:      The examined portions of the nasopharynx, oropharynx and larynx were       normal.      One tongue of salmon-colored mucosa was present at 37 cm. The maximum       longitudinal extent of these esophageal mucosal changes was 1 cm in       length. Biopsies were taken with a cold forceps for histology. Estimated       blood loss was minimal.      The exam of the esophagus was otherwise normal.      One non-bleeding curvilinear gastric ulcer with no stigmata of bleeding       was found in the prepyloric region of the stomach. Biopsies were taken       with a cold forceps for histology. Estimated blood loss was minimal.      Few non-bleeding superficial gastric ulcers with no stigmata of bleeding       were found in the gastric antrum and distal gastric body. The largest       lesion was 2 mm in largest dimension. Biopsies were taken with a cold       forceps for Helicobacter pylori testing. Estimated blood loss was       minimal.  The exam of the stomach was otherwise normal. Biopsies were taken with a       cold forceps for Helicobacter pylori testing. Estimated blood loss was       minimal.      An acquired benign-appearing, intrinsic moderate stenosis was found in       the duodenal bulb and was traversed. Biopsies were taken with a cold       forceps for histology. Estimated blood loss was minimal.      The exam of the duodenum was otherwise normal. Impression:                - The examined portions of the nasopharynx,                            oropharynx and larynx were normal.                           - Salmon-colored mucosa suspicious for                            short-segment Barrett's esophagus. Biopsied.                           - Non-bleeding gastric ulcer with no stigmata of                            bleeding. Biopsied. This ulcer appeared benign.                            Likely related to NSAID usage vs. H. pylori.                           - Non-bleeding gastric ulcers with no stigmata of                            bleeding. Biopsied.                           - Acquired duodenal stenosis. Biopsied. Moderate Sedation:      Not Applicable - Patient had care per Anesthesia. Recommendation:           - Return patient to hospital ward for possible                            discharge same day.                           - Full liquid diet today.                           - Use a proton pump inhibitor PO BID for 8 weeks,                            then once daily indefinitely.                           - Use sucralfate  suspension 1 gram PO QID for 2                            weeks to help ulcer heal.                           - Await pathology results.                           - No aspirin, ibuprofen, naproxen, or other                            non-steroidal anti-inflammatory drugs.                           - Repeat upper endoscopy in 8 weeks to ensure                            healing of ulcers. Procedure Code(s):        --- Professional ---                           [phone], Esophagogastroduodenoscopy, flexible,                            transoral; with biopsy, single or multiple Diagnosis Code(s):        --- Professional ---                           K22.8, Other specified diseases of esophagus                           K25.9, Gastric ulcer, unspecified as acute or                            chronic, without hemorrhage or  perforation                           K31.5, Obstruction of duodenum                           R10.30, Lower abdominal pain, unspecified                           R11.2, Nausea with vomiting, unspecified                           R93.3, Abnormal findings on diagnostic imaging of                            other parts of digestive tract CPT copyright 2019 [employer]. All rights reserved. The codes documented in this report are preliminary and upon coder review may  be revised to meet current compliance requirements. Calixto Pavel E. Candis Schatz, MD 02/11/2022 2:25:31 PM This report has been signed electronically. Number of Addenda: 0

## 2022-02-11 NOTE — Anesthesia Postprocedure Evaluation (Signed)
Anesthesia Post Note  Patient: Paula Ramos  Procedure(s) Performed: ESOPHAGOGASTRODUODENOSCOPY (EGD) WITH PROPOFOL BIOPSY     Patient location during evaluation: Endoscopy Anesthesia Type: MAC Level of consciousness: awake and alert Pain management: pain level controlled Vital Signs Assessment: post-procedure vital signs reviewed and stable Respiratory status: spontaneous breathing, nonlabored ventilation, respiratory function stable and patient connected to nasal cannula oxygen Cardiovascular status: blood pressure returned to baseline and stable Postop Assessment: no apparent nausea or vomiting Anesthetic complications: no   No notable events documented.  Last Vitals:  Vitals:   02/11/22 1433 02/11/22 1442  BP: (!) 140/48 (!) 149/52  Pulse: 82 72  Resp: (!) 25 13  Temp:    SpO2: 96% 94%    Last Pain:  Vitals:   02/11/22 1442  TempSrc:   PainSc: 0-No pain                 Barnet Glasgow

## 2022-02-11 NOTE — Interval H&P Note (Signed)
History and Physical Interval Note:  02/11/2022 1:47 PM  Paula Ramos  has presented today for surgery, with the diagnosis of vomiting, gastric wall thickening on CT scan.  The various methods of treatment have been discussed with the patient and family. After consideration of risks, benefits and other options for treatment, the patient has consented to  Procedure(s): ESOPHAGOGASTRODUODENOSCOPY (EGD) WITH PROPOFOL (N/A) as a surgical intervention.  The patient's history has been reviewed, patient examined, no change in status, stable for surgery.  I have reviewed the patient's chart and labs.  Questions were answered to the patient's satisfaction.     Jenel Lucks

## 2022-02-11 NOTE — Progress Notes (Signed)
[facility] Progress Note     Subjective: CC:  Paula Ramos. Reports sinus headache and mild epigastric pain. Epig pain reported as much better compared to Sunday. Denies nausea or vomiting. +flatus. Denies BM since Saturday.  Objective: Vital signs in last 24 hours: Temp:  [98 F (36.7 C)-98.2 F (36.8 C)] 98 F (36.7 C) (06/06 0507) Pulse Rate:  [55-67] 55 (06/06 0507) Resp:  [17-18] 17 (06/06 0507) BP: (106-137)/(55-63) 126/60 (06/06 0507) SpO2:  [98 %-100 %] 99 % (06/06 0507) Last BM Date : 02/07/22  Intake/Output from previous day: 06/05 0701 - 06/06 0700 In: 2638.3 [P.O.:300; I.V.:2038.3; IV Piggyback:300] Out: 800 [Urine:800] Intake/Output this shift: No intake/output data recorded.  PE: Gen:  Alert, NAD, cooperative Card:  Regular rate and rhythm Pulm:  Normal effort, clear to auscultation bilaterally Abd: Soft, mild TTP epigastric region without peritonitis/rebound tenderness,  no palpable organomegaly Skin: warm and dry, no rashes  Psych: A&Ox3   Lab Results:  Recent Labs    02/10/22 0506 02/10/22 0847 02/11/22 0429  WBC 11.5*  --  8.5  HGB 9.9* 9.4* 9.4*  HCT 30.3* 28.4* 30.0*  PLT 158  --  143*   BMET Recent Labs    02/10/22 0506 02/11/22 0429  NA 142 142  K 3.7 3.9  CL 108 109  CO2 29 28  GLUCOSE 132* 103*  BUN 32* 19  CREATININE 0.90 0.80  CALCIUM 8.8* 8.9   PT/INR Recent Labs    02/10/22 0506  LABPROT 15.7*  INR 1.3*   CMP     Component Value Date/Time   NA 142 02/11/2022 0429   NA 145 (H) 04/22/2021 1224   K 3.9 02/11/2022 0429   CL 109 02/11/2022 0429   CO2 28 02/11/2022 0429   GLUCOSE 103 (H) 02/11/2022 0429   BUN 19 02/11/2022 0429   BUN 15 04/22/2021 1224   CREATININE 0.80 02/11/2022 0429   CALCIUM 8.9 02/11/2022 0429   PROT 5.8 (L) 02/11/2022 0429   PROT 7.3 04/22/2021 1224   ALBUMIN 3.3 (L) 02/11/2022 0429   ALBUMIN 5.1 (H) 04/22/2021 1224   AST 17 02/11/2022 0429   ALT 13 02/11/2022 0429   ALKPHOS 38  02/11/2022 0429   BILITOT 0.7 02/11/2022 0429   BILITOT 0.3 04/22/2021 1224   GFRNONAA >60 02/11/2022 0429   GFRAA >60 06/07/2018 2151   Lipase     Component Value Date/Time   LIPASE 29 02/09/2022 1338       Studies/Results: CT ABDOMEN PELVIS W CONTRAST  Result Date: 02/09/2022 CLINICAL DATA:  Left lower quadrant pain.  Emesis. EXAM: CT ABDOMEN AND PELVIS WITH CONTRAST TECHNIQUE: Multidetector CT imaging of the abdomen and pelvis was performed using the standard protocol following bolus administration of intravenous contrast. RADIATION DOSE REDUCTION: This exam was performed according to the departmental dose-optimization program which includes automated exposure control, adjustment of the mA and/or kV according to patient size and/or use of iterative reconstruction technique. CONTRAST:  OMNIPAQUE IOHEXOL 300 MG/ML  SOLN COMPARISON:  12/20/2021 renal ultrasound.  No comparison CT. FINDINGS: Lower chest: Clear lung bases. Normal heart size without pericardial or pleural effusion. Mild pectus excavatum deformity. Hepatobiliary: Normal liver. Normal gallbladder, without biliary ductal dilatation. Pancreas: Normal, without mass or ductal dilatation. Spleen: Normal in size, without focal abnormality. Adrenals/Urinary Tract: Normal adrenal glands. Bilateral too small to characterize renal lesions which are well-circumscribed. Interpolar right renal lesion measures 6 mm and greater than fluid density on 17/7 and 27/2. No hydronephrosis. Normal  urinary bladder. Stomach/Bowel: The gastric body and antrum are diffusely thick walled with mucosal hyperenhancement, including on 34/2 and coronal image 46. Gas tracks along the periphery of the superior aspect of the gastric antrum including on 32/2 and 42/4. Normal colon, appendix, and terminal ileum. Normal small bowel. Vascular/Lymphatic: Aortic atherosclerosis. No abdominopelvic adenopathy. Reproductive: Normal uterus and adnexa. Other: No significant  free fluid.  Mild pelvic floor laxity. 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 IMPRESSION: 1. Moderate gastric body and antral wall thickening with mucosal hyperenhancement, consistent with gastritis. Gas tracking towards the cephalad aspect of the gastric antrum could represent an ulcer or even localized perforation. No free perforation identified. 2. No other explanation for abdominal pain. 3. Too small to characterize renal lesions. Interpolar right renal 6 mm lesion demonstrates complexity. Consider re-evaluation with pre and post contrast abdominal MRI at 12 months. 4.  Aortic Atherosclerosis (ICD10-I70.0). Electronically Signed   By: Jeronimo Greaves M.D.   On: 02/09/2022 17:45    Anti-infectives: Anti-infectives (From admission, onward)    Start     Dose/Rate Route Frequency Ordered Stop   02/10/22 1000  cefTRIAXone (ROCEPHIN) 2 g in sodium chloride 0.9 % 100 mL IVPB        2 g 200 mL/hr over 30 Minutes Intravenous Every 24 hours 02/09/22 1935     02/09/22 1945  cefTRIAXone (ROCEPHIN) 2 g in sodium chloride 0.9 % 100 mL IVPB        2 g 200 mL/hr over 30 Minutes Intravenous  Once 02/09/22 1941 02/09/22 2028   02/09/22 1900  cefTRIAXone (ROCEPHIN) 1 g in sodium chloride 0.9 % 100 mL IVPB  Status:  Discontinued        1 g 200 mL/hr over 30 Minutes Intravenous  Once 02/09/22 1850 02/09/22 1941   02/09/22 1900  metroNIDAZOLE (FLAGYL) IVPB 500 mg        500 mg 100 mL/hr over 60 Minutes Intravenous Every 12 hours 02/09/22 1850          Assessment/Plan  Acute abdominal pain w/ a history of chronic bloating and GERD. PMH PUD.  - CT shows thickening of the distal stomach consistent with gastritis. No pneumoperitoneum to suggest frank perforation.  - afebrile, VSS, WBC 8.5 - abd exam w/ mild epigastric pain but no peritonitis - scheduled for EGD today and we will follow theses results. No emergent surgical needs today and no current plans for surgery, we will follow.   - PPI , carafate for suspected PUD  FEN: NPO, IVF ID: Rocephin, Flagyl VTE: SCD's, chemical VTE is held suspect due to possible procedure.  Foley: none Dispo: floor, TRH service    LOS: 2 days   I reviewed nursing notes, hospitalist notes, last 24 h vitals and pain scores, last 48 h intake and output, last 24 h labs and trends, and last 24 h imaging results.  Hosie Spangle, PA-C [facility] Please see Amion for pager number during day hours 7:00am-4:30pm

## 2022-02-11 NOTE — Progress Notes (Signed)
  Progress Note   Patient: Paula Ramos [id number] DOB: [birth year] DOA: 02/09/2022     2 DOS: the patient was seen and examined on 02/11/2022   Brief hospital course: 56 y.o. female with medical history significant for peptic ulcer disease, chronic anemia with baseline hemoglobin 11-12, allergic rhinitis, who is admitted to [facility] on 02/09/2022 with epigastric pain after presenting from home to [facility] ED complaining of such  Assessment and Plan: Epigastric pain -Hx known peptic ulcers -CT without acute intra-abdominal process noted -Seen by Gen Surgery. GI consulted. Pt now s/p EGD 6/6 with findings suspicious of Barrett's esophaus, non-bleeding gastric ulcer without stigmata of bleeding. Biopsies were taken -GI recs for Full liquids today, PPIBID x 8 weeks, then once daily indefinitely, also sucralfate QID x 2 weeks -Will d/c empiric abx that was started at time of admit -Per GI, clear for d/c as early as today. This afternoon, pt reports feeling "queasy" and asking to spend one more night to see how she tolerates liquid diet  Chronic anemia -Hemodynamically stable -Repeat CBC in AM  Leukocytosis -Stable currently. Afebrile -holding further empiric abx per above  Subjective: Seen after EGD. Pt reports feeling better but still "queasy." Unsure of how she will tolerate liquid diet tonight  Physical Exam: Vitals:   02/11/22 1421 02/11/22 1433 02/11/22 1442 02/11/22 1602  BP: (!) 165/70 (!) 140/48 (!) 149/52 (!) 143/78  Pulse: 67 82 72 73  Resp: 16 (!) 25 13 18   Temp: 98.4 F (36.9 C)   98.4 F (36.9 C)  TempSrc: Temporal   Oral  SpO2: 100% 96% 94% 98%  Weight:      Height:       General exam: Conversant, in no acute distress Respiratory system: normal chest rise, clear, no audible wheezing Cardiovascular system: regular rhythm, s1-s2 Gastrointestinal system: Nondistended, nontender, pos BS Central nervous system: No seizures, no tremors Extremities: No cyanosis, no  joint deformities Skin: No rashes, no pallor Psychiatry: Affect normal // no auditory hallucinations   Data Reviewed:  Labs reviewed: Cr 0.80, Hgb 9.4  Family Communication: Pt in room, family not at bedside  Disposition: Status is: Inpatient Remains inpatient appropriate because: Severity of illness  Planned Discharge Destination: Home    Author: , MD 02/11/2022 4:29 PM  For on call review [email or web address].

## 2022-02-11 NOTE — Transfer of Care (Signed)
Immediate Anesthesia Transfer of Care Note  Patient: Paula Ramos  Procedure(s) Performed: ESOPHAGOGASTRODUODENOSCOPY (EGD) WITH PROPOFOL BIOPSY  Patient Location: PACU and Endoscopy Unit  Anesthesia Type:MAC  Level of Consciousness: awake, alert  and patient cooperative  Airway & Oxygen Therapy: Patient Spontanous Breathing and Patient connected to face mask oxygen  Post-op Assessment: Report given to RN and Post -op Vital signs reviewed and stable  Post vital signs: Reviewed and stable  Last Vitals:  Vitals Value Taken Time  BP    Temp    Pulse 65 02/11/22 1420  Resp 15 02/11/22 1420  SpO2 100 % 02/11/22 1420  Vitals shown include unvalidated device data.  Last Pain:  Vitals:   02/11/22 1225  TempSrc:   PainSc: 2       Patients Stated Pain Goal: 3 (81/27/51 7001)  Complications: No notable events documented.

## 2022-02-11 NOTE — Progress Notes (Signed)
LR infusion stopped and no replacement bags available in hospital. On-call NP notified, no new orders given. Pt tolerating full liquid diet.

## 2022-02-12 ENCOUNTER — Other Ambulatory Visit: Payer: Self-pay

## 2022-02-12 ENCOUNTER — Encounter (HOSPITAL_COMMUNITY): Payer: Self-pay | Admitting: Gastroenterology

## 2022-02-12 DIAGNOSIS — R1013 Epigastric pain: Secondary | ICD-10-CM | POA: Diagnosis not present

## 2022-02-12 LAB — CBC
HCT: 28 % — ABNORMAL LOW (ref 36.0–46.0)
Hemoglobin: 9 g/dL — ABNORMAL LOW (ref 12.0–15.0)
MCH: 33.3 pg (ref 26.0–34.0)
MCHC: 32.1 g/dL (ref 30.0–36.0)
MCV: 103.7 fL — ABNORMAL HIGH (ref 80.0–100.0)
Platelets: 135 10*3/uL — ABNORMAL LOW (ref 150–400)
RBC: 2.7 MIL/uL — ABNORMAL LOW (ref 3.87–5.11)
RDW: 12.3 % (ref 11.5–15.5)
WBC: 6.8 10*3/uL (ref 4.0–10.5)
nRBC: 0 % (ref 0.0–0.2)

## 2022-02-12 LAB — COMPREHENSIVE METABOLIC PANEL
ALT: 11 U/L (ref 0–44)
AST: 19 U/L (ref 15–41)
Albumin: 3 g/dL — ABNORMAL LOW (ref 3.5–5.0)
Alkaline Phosphatase: 37 U/L — ABNORMAL LOW (ref 38–126)
Anion gap: 8 (ref 5–15)
BUN: 17 mg/dL (ref 6–20)
CO2: 25 mmol/L (ref 22–32)
Calcium: 8.7 mg/dL — ABNORMAL LOW (ref 8.9–10.3)
Chloride: 107 mmol/L (ref 98–111)
Creatinine, Ser: 0.77 mg/dL (ref 0.44–1.00)
GFR, Estimated: >60 mL/min (ref >60)
Glucose, Bld: 98 mg/dL (ref 70–99)
Potassium: 3.6 mmol/L (ref 3.5–5.1)
Sodium: 140 mmol/L (ref 135–145)
Total Bilirubin: 0.7 mg/dL (ref 0.3–1.2)
Total Protein: 5.6 g/dL — ABNORMAL LOW (ref 6.5–8.1)

## 2022-02-12 MED ORDER — PANTOPRAZOLE SODIUM 40 MG PO TBEC: 40.0000 mg | DELAYED_RELEASE_TABLET | Freq: Two times a day (BID) | ORAL | 1 refills | Status: DC

## 2022-02-12 MED ORDER — SUCRALFATE 1 G PO TABS
1.0000 g | ORAL_TABLET | Freq: Four times a day (QID) | ORAL | 0 refills | Status: DC
Start: 2022-02-12 — End: 2022-02-27

## 2022-02-12 NOTE — TOC Transition Note (Signed)
Transition of Care [facility]) - CM/SW Discharge Note   Patient Details  Name: Paula Ramos MRN: [id number] Date of Birth: 02-29-1966  Transition of Care [facility]) CM/SW Contact:  Golda Acre, RN Phone Number: 02/12/2022, 10:00 AM   Clinical Narrative:    Patient dcd to home with no toc needs.         Patient Goals and CMS Choice        Discharge Placement                       Discharge Plan and Services                                     Social Determinants of Health (SDOH) Interventions     Readmission Risk Interventions     View : No data to display.

## 2022-02-12 NOTE — TOC Progression Note (Signed)
Transition of Care [facility]) - Progression Note    Patient Details  Name: Paula Ramos MRN: [id number] Date of Birth: 1966-06-14  Transition of Care [facility]) CM/SW Contact  Leeroy Cha, RN Phone Number: 02/12/2022, 7:53 AM  Clinical Narrative:    Following for toc needs        Expected Discharge Plan and Services                                                 Social Determinants of Health (SDOH) Interventions    Readmission Risk Interventions     View : No data to display.

## 2022-02-12 NOTE — Discharge Summary (Signed)
Physician Discharge Summary  Paula BARTEK ZOX:[id number] DOB: 01/13/1966 DOA: 02/09/2022  PCP: Shon Hale, MD  Admit date: 02/09/2022 Discharge date: 02/12/2022  Admitted From: home Disposition:  home  Recommendations for Outpatient Follow-up:  Follow up with PCP in 1-2 weeks Follow-up with GI in 8 weeks for repeat EGD  Home Health: none Equipment/Devices: none  Discharge Condition: stable CODE STATUS: Full code Diet Orders (From admission, onward)     Start     Ordered   02/12/22 0758  DIET SOFT Room service appropriate? Yes; Fluid consistency: Thin  Diet effective now       Question Answer Comment  Room service appropriate? Yes   Fluid consistency: Thin      02/12/22 0757            HPI: Per admitting MD, Paula Ramos is a 56 y.o. female with medical history significant for peptic ulcer disease, chronic anemia with baseline hemoglobin 11-12, allergic rhinitis, who is admitted to [facility] on 02/09/2022 with epigastric pain after presenting from home to [facility] ED complaining of such. The patient notes 1 day of sharp, nonradiating Epigastric pain that has been constant since onset and worse when attempting to eat or drink anything over that timeframe.  This been associated with intermittent nausea resulting in 2-3 episodes of nonbloody, nonbilious emesis.  She also denies any associated coffee-ground appearance to this emesis.  Denies any recent melena or hematochezia.  No recent diarrhea.  Denies any recent trauma or travel.  She notes a history of peptic ulcer disease that was diagnosed a few years ago when she was experiencing very similar abdominal discomfort associate with nausea and vomiting. At that time she was evaluated by [facility] gastroenterology, reportedly underwent EGD as part of diagnostic evaluation.  Not currently on any PPI or H2 blocker.  Reports that her alcohol consumption consists of 1-2 beers today, without any recent increase in volume or  frequency of hyper alcohol consumption.  She notes NSAID use in the form of ibuprofen 400 to 600 mg at a frequency of less than 1 time per week.  Not on any blood thinners, including no aspirin.  No known liver disease. Not a/w any recent subjective fever, chills, rigors, generalized myalgias.  No rash, dysuria, gross hematuria.  Denies any recent chest pain, shortness of breath, palpitations, dizziness, presyncope, syncope. Medical history also notable for chronic anemia with baseline hemoglobin 11-12, with most recent prior hemoglobin data point noted to be 11.4 on 04/22/2021.  Hospital Course / Discharge diagnoses: Principal Problem:   Epigastric pain Active Problems:   Allergic rhinitis   Acute prerenal azotemia   Leukocytosis   Chronic anemia  Active problems Epigastric pain due to PUD-GI consulted and followed patient while hospitalized.  General surgery was also consulted.  She underwent an EGD on 6/6 with findings suspicious for Barrett's esophagus, nonbleeding gastric ulcer without stigmata of bleeding.  Biopsies were taken.  She was placed on PPI twice daily, diet was advanced and she is tolerating a regular diet.  She will be discharged home in stable condition, on PPI twice daily for 8 weeks then once daily indefinitely, also sucralfate 4 times daily x2 weeks.  Active problems Chronic anemia-hemoglobin stable, no bleeding Leukocytosis-stable, afebrile, WBC has normalized  Sepsis ruled out   Discharge Instructions   Allergies as of 02/12/2022   No Known Allergies      Medication List     TAKE these medications    cetirizine  10 MG tablet Commonly known as: ZYRTEC Take 1 tablet (10 mg total) by mouth daily.   MULTI-VITAMIN DAILY PO Take 1 tablet by mouth daily.   pantoprazole 40 MG tablet Commonly known as: PROTONIX Take 1 tablet (40 mg total) by mouth 2 (two) times daily.   sucralfate 1 g tablet Commonly known as: CARAFATE Take 1 tablet (1 g total) by mouth 4  (four) times daily for 14 days.   tiZANidine 2 MG tablet Commonly known as: ZANAFLEX Take 1-2 tablets (2-4 mg total) by mouth every 6 (six) hours as needed for muscle spasms.   triamcinolone 55 MCG/ACT Aero nasal inhaler Commonly known as: NASACORT Place 2 sprays into the nose daily.         Consultations: GI Surgery  Procedures/Studies: EGD  CT ABDOMEN PELVIS W CONTRAST  Result Date: 02/09/2022 CLINICAL DATA:  Left lower quadrant pain.  Emesis. EXAM: CT ABDOMEN AND PELVIS WITH CONTRAST TECHNIQUE: Multidetector CT imaging of the abdomen and pelvis was performed using the standard protocol following bolus administration of intravenous contrast. RADIATION DOSE REDUCTION: This exam was performed according to the departmental dose-optimization program which includes automated exposure control, adjustment of the mA and/or kV according to patient size and/or use of iterative reconstruction technique. CONTRAST:  100mL OMNIPAQUE IOHEXOL 300 MG/ML  SOLN COMPARISON:  12/20/2021 renal ultrasound.  No comparison CT. FINDINGS: Lower chest: Clear lung bases. Normal heart size without pericardial or pleural effusion. Mild pectus excavatum deformity. Hepatobiliary: Normal liver. Normal gallbladder, without biliary ductal dilatation. Pancreas: Normal, without mass or ductal dilatation. Spleen: Normal in size, without focal abnormality. Adrenals/Urinary Tract: Normal adrenal glands. Bilateral too small to characterize renal lesions which are well-circumscribed. Interpolar right renal lesion measures 6 mm and greater than fluid density on 17/7 and 27/2. No hydronephrosis. Normal urinary bladder. Stomach/Bowel: The gastric body and antrum are diffusely thick walled with mucosal hyperenhancement, including on 34/2 and coronal image 46. Gas tracks along the periphery of the superior aspect of the gastric antrum including on 32/2 and 42/4. Normal colon, appendix, and terminal ileum. Normal small bowel.  Vascular/Lymphatic: Aortic atherosclerosis. No abdominopelvic adenopathy. Reproductive: Normal uterus and adnexa. Other: No significant free fluid.  Mild pelvic floor laxity. 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 IMPRESSION: 1. Moderate gastric body and antral wall thickening with mucosal hyperenhancement, consistent with gastritis. Gas tracking towards the cephalad aspect of the gastric antrum could represent an ulcer or even localized perforation. No free perforation identified. 2. No other explanation for abdominal pain. 3. Too small to characterize renal lesions. Interpolar right renal 6 mm lesion demonstrates complexity. Consider re-evaluation with pre and post contrast abdominal MRI at 12 months. 4.  Aortic Atherosclerosis (ICD10-I70.0). Electronically Signed   By: Jeronimo GreavesKyle  Talbot M.D.   On: 02/09/2022 17:45     Subjective: - no chest pain, shortness of breath, no abdominal pain, nausea or vomiting.   Discharge Exam: BP (!) 125/51 (BP Location: Right Arm)   Pulse (!) 56   Temp 98.4 F (36.9 C) (Oral)   Resp 17   Ht 4\' 10"  (1.473 m)   Wt 40.2 kg   SpO2 97%   BMI 18.52 kg/m   General: Pt is alert, awake, not in acute distress Cardiovascular: RRR, S1/S2 +, no rubs, no gallops Respiratory: CTA bilaterally, no wheezing, no rhonchi Abdominal: Soft, NT, ND, bowel sounds + Extremities: no edema, no cyanosis    The results of significant diagnostics from this hospitalization (including imaging, microbiology, ancillary and laboratory)  are listed below for reference.     Microbiology: Recent Results (from the past 240 hour(s))  Wet prep, genital     Status: None   Collection Time: 02/09/22  6:07 PM  Result Value Ref Range Status   Yeast Wet Prep HPF POC NONE SEEN NONE SEEN Final   Trich, Wet Prep NONE SEEN NONE SEEN Final   Clue Cells Wet Prep HPF POC NONE SEEN NONE SEEN Final   WBC, Wet Prep HPF POC <10 <10 Final   Sperm NONE SEEN  Final    Comment:  Performed at [facility], [address], [place], [place] [address]     Labs: Basic Metabolic Panel: Recent Labs  Lab 02/09/22 1338 02/10/22 0506 02/11/22 0429 02/12/22 0351  NA 143 142 142 140  K 4.0 3.7 3.9 3.6  CL 107 108 109 107  CO2 29 29 28 25   GLUCOSE 173* 132* 103* 98  BUN 32* 32* 19 17  CREATININE 0.82 0.90 0.80 0.77  CALCIUM 9.3 8.8* 8.9 8.7*  MG  --  1.8  --   --    Liver Function Tests: Recent Labs  Lab 02/09/22 1338 02/10/22 0506 02/11/22 0429 02/12/22 0351  AST 22 17 17 19   ALT 19 15 13 11   ALKPHOS 61 45 38 37*  BILITOT 0.9 1.2 0.7 0.7  PROT 7.8 6.1* 5.8* 5.6*  ALBUMIN 4.7 3.5 3.3* 3.0*   CBC: Recent Labs  Lab 02/09/22 1338 02/10/22 0100 02/10/22 0506 02/10/22 0847 02/11/22 0429 02/12/22 0351  WBC 12.2*  --  11.5*  --  8.5 6.8  NEUTROABS  --   --  9.3*  --   --   --   HGB 11.9* 10.9* 9.9* 9.4* 9.4* 9.0*  HCT 35.5* 33.5* 30.3* 28.4* 30.0* 28.0*  MCV 101.1*  --  102.4*  --  106.8* 103.7*  PLT 208  --  158  --  143* 135*   CBG: No results for input(s): GLUCAP in the last 168 hours. Hgb A1c No results for input(s): HGBA1C in the last 72 hours. Lipid Profile No results for input(s): CHOL, HDL, LDLCALC, TRIG, CHOLHDL, LDLDIRECT in the last 72 hours. Thyroid function studies No results for input(s): TSH, T4TOTAL, T3FREE, THYROIDAB in the last 72 hours.  Invalid input(s): FREET3 Urinalysis    Component Value Date/Time   COLORURINE YELLOW 02/09/2022 1605   APPEARANCEUR CLEAR 02/09/2022 1605   LABSPEC 1.021 02/09/2022 1605   PHURINE 6.0 02/09/2022 1605   GLUCOSEU NEGATIVE 02/09/2022 1605   HGBUR NEGATIVE 02/09/2022 1605   BILIRUBINUR NEGATIVE 02/09/2022 1605   BILIRUBINUR negative 04/22/2021 1148   KETONESUR 20 (A) 02/09/2022 1605   PROTEINUR NEGATIVE 02/09/2022 1605   UROBILINOGEN 0.2 04/22/2021 1148   NITRITE NEGATIVE 02/09/2022 1605   LEUKOCYTESUR NEGATIVE 02/09/2022 1605    FURTHER DISCHARGE INSTRUCTIONS:    Get Medicines reviewed and adjusted: Please take all your medications with you for your next visit with your Primary MD   Laboratory/radiological data: Please request your Primary MD to go over all hospital tests and procedure/radiological results at the follow up, please ask your Primary MD to get all Hospital records sent to his/her office.   In some cases, they will be blood work, cultures and biopsy results pending at the time of your discharge. Please request that your primary care M.D. goes through all the records of your hospital data and follows up on these results.   Also Note the following: If you experience worsening of  your admission symptoms, develop shortness of breath, life threatening emergency, suicidal or homicidal thoughts you must seek medical attention immediately by calling 911 or calling your MD immediately  if symptoms less severe.   You must read complete instructions/literature along with all the possible adverse reactions/side effects for all the Medicines you take and that have been prescribed to you. Take any new Medicines after you have completely understood and accpet all the possible adverse reactions/side effects.    Do not drive when taking Pain medications or sleeping medications (Benzodaizepines)   Do not take more than prescribed Pain, Sleep and Anxiety Medications. It is not advisable to combine anxiety,sleep and pain medications without talking with your primary care practitioner   Special Instructions: If you have smoked or chewed Tobacco  in the last 2 yrs please stop smoking, stop any regular Alcohol  and or any Recreational drug use.   Wear Seat belts while driving.   Please note: You were cared for by a hospitalist during your hospital stay. Once you are discharged, your primary care physician will handle any further medical issues. Please note that NO REFILLS for any discharge medications will be authorized once you are discharged, as it is  imperative that you return to your primary care physician (or establish a relationship with a primary care physician if you do not have one) for your post hospital discharge needs so that they can reassess your need for medications and monitor your lab values.  Time coordinating discharge: 40 minutes  SIGNED:  Pamella Pert, MD, PhD 02/12/2022, 9:57 AM

## 2022-02-13 LAB — SURGICAL PATHOLOGY

## 2022-02-18 NOTE — Progress Notes (Signed)
Paula Ramos,  The biopsies taken from your stomach were notable for mild chronic gastritis (inflammation) and reactive gastropathy, but there was no evidence of Helicobacter pylori infection. Most ulcers are related to medications such as aspirin, ibuprofen, etc (NSAIDs).   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 The biopsies taken in your esophagus showed some mild inflammatory changes, but did not show any evidence of Barrett's esophagus (intestinal metaplasia)  Please avoid all NSAIDs, continue to take the Protonix twice a day until we repeat your upper endoscopy and confirm healing of the ulcers.   Please keep your follow up appointment in our clinic next week.

## 2022-02-20 ENCOUNTER — Other Ambulatory Visit: Payer: Self-pay

## 2022-02-27 ENCOUNTER — Ambulatory Visit: Payer: 59 | Admitting: Nurse Practitioner

## 2022-02-27 ENCOUNTER — Encounter: Payer: Self-pay | Admitting: Nurse Practitioner

## 2022-02-27 VITALS — BP 90/60 | HR 58 | Ht <= 58 in | Wt 88.0 lb

## 2022-02-27 DIAGNOSIS — K259 Gastric ulcer, unspecified as acute or chronic, without hemorrhage or perforation: Secondary | ICD-10-CM | POA: Diagnosis not present

## 2022-02-27 NOTE — Patient Instructions (Addendum)
If you are age 58 or older, your body mass index should be between 23-30. Your Body mass index is 18.39 kg/m. If this is out of the aforementioned range listed, please consider follow up with your Primary Care Provider.  If you are age 43 or younger, your body mass index should be between 19-25. Your Body mass index is 18.39 kg/m. If this is out of the aformentioned range listed, please consider follow up with your Primary Care Provider.   ________________________________________________________  The [facility] providers would like to encourage you to use [facility] to communicate with providers for non-urgent requests or questions.  Due to long hold times on the telephone, sending your provider a message by [facility] may be a faster and more efficient way to get a response.  Please allow 48 business hours for a response.  Please remember that this is for non-urgent requests.  _______________________________________________________   Continue pantoprazole twice daily for 6 weeks and then decrease to once daily to continue indefinitely.   Avoid anti-inflammatory medications such as Advil, Motrin and Aleve.  Follow up with [place] as needed.   It was a pleasure to see you today!  Thank you for trusting me with your gastrointestinal care!

## 2022-02-27 NOTE — Progress Notes (Signed)
Chief Complaint: Hospital follow-up   Assessment &  Plan   56 year old female with a past medical history significant for PUD, chronic anemia.   PUD. Admitted earlier this month with abdominal pain, vomiting with dark emesis and CT scan showing gastric and antral wall thickening with mucosal enhancement and possible localized perforation. Inpatient EGD showed non-bleeding gastric ulcer ( no H.pylori nor malignancy on biopsies) and acquired duodenal stenosis. Completed carafate, taking BID Pantoprazole. Symptoms have resolved.  Needs additional 6 weeks of twice daily pantoprazole then can decrease to once a day to take indefinitely Avoid anti-inflammatories Follow-up as needed Will discuss with patient's primary GI, Dr Rush Landmark as to whether he would like a repeat EGD to document ulcer healing.  HPI   Patient is known to Dr. Rush Landmark but was recently seen by [place] in the hospital for evaluation of abdominal pain , nausea / vomiting with dark emesis.  CT scan showed gastric and antral wall thickening with mucosal enhancement and possible localized perforation. She underwent inpatient EGD with findings of possible short segment Barrett's,  nonbleeding gastric ulcer without stigmata of bleeding.  The ulcer appeared benign.  There was acquired duodenal .  Esophageal biopsies without definite Barrett's.  Gastric biopsies compatible with ulcer.  No H. Pylori.   Interval History:  She is taking pantoprazole BID and completed the two weeks of Carafate. No further nausea / vomiting and no abdominal pain. Appetite is okay.   Previous GI Evaluation   November 2019 colonoscopy -Perianal skin tags found on perianal exam. - Non-thrombosed internal hemorrhoids found on digital rectal exam. - Tortuous colon. - The examined portion of the ileum was normal. - Normal mucosa in the entire examined colon. - Anal papilla(e) were hypertrophied. - Non-bleeding non-thrombosed internal hemorrhoids  02/11/22  EGD - The examined portions of the nasopharynx, oropharynx and larynx were normal. - Salmon-colored mucosa suspicious for short-segment Barrett's esophagus. Biopsied. - Non-bleeding gastric ulcer with no stigmata of bleeding. Biopsied. This ulcer appeared benign. Likely related to NSAID usage vs. H. pylori. - Non-bleeding gastric ulcers with no stigmata of bleeding. Biopsied. - Acquired duodenal stenosis. Biopsied FINAL MICROSCOPIC DIAGNOSIS:   A.   DUODENAL BULB, STRICTURE, BIOPSY:  Tangentially sectioned fragments of duodenal mucosa with apparently  normal villous and crypt architecture.  Mild inflammation is seen but no increase in intraepithelial lymphocytes is noted.  No dysplasia or malignancy is seen.   B.   STOMACH, ULCER, BIOPSY:  Fragments of gastric mucosa and necrotic inflamed tissue consistent with clinical diagnosis of gastric ulcer.  Immunohistochemistry with appropriate controls for pancytokeratin does  not reveal any carcinoma and for H. pylori does not highlight any H.  pylori-like organisms.  Alcian blue stain with appropriate controls does not highlight any  intestinal metaplasia.   C.   STOMACH, BIOPSY:  Reactive gastropathy with focal erosion/ulcer.  No dysplasia or malignancy is seen.  Alcian blue stain with appropriate controls does not highlight any  intestinal metaplasia.  mmunohistochemistry with appropriate controls for H. pylori is negative  for H. pylori-like organisms.   D.   GEJ, BIOPSY:  Fragments of squamous and glandular mucosa with mild inflammation.  No dysplasia or malignancy is seen.  Alcian blue stain does not highlight any definite intestinal metaplasia.  Clinical and endoscopic correlation is required.   Imaging   02/09/22 CT scan  IMPRESSION: 1. Moderate gastric body and antral wall thickening with mucosal hyperenhancement, consistent with gastritis. Gas tracking towards the cephalad aspect of the gastric  antrum could represent an ulcer or  even localized perforation. No free perforation identified. 2. No other explanation for abdominal pain. 3. Too small to characterize renal lesions. Interpolar right renal 6 mm lesion demonstrates complexity. Consider re-evaluation with pre and post contrast abdominal MRI at 12 months. 4.  Aortic Atherosclerosis  Labs:     Latest Ref Rng & Units 02/12/2022    3:51 AM 02/11/2022    4:29 AM 02/10/2022    8:47 AM  CBC  WBC 4.0 - 10.5 K/uL 6.8  8.5    Hemoglobin 12.0 - 15.0 g/dL 9.0  9.4  9.4   Hematocrit 36.0 - 46.0 % 28.0  30.0  28.4   Platelets 150 - 400 K/uL 135  143         Latest Ref Rng & Units 02/12/2022    3:51 AM 02/11/2022    4:29 AM 02/10/2022    5:06 AM  Hepatic Function  Total Protein 6.5 - 8.1 g/dL 5.6  5.8  6.1   Albumin 3.5 - 5.0 g/dL 3.0  3.3  3.5   AST 15 - 41 U/L $Remo'19  17  17   'HMXbX$ ALT 0 - 44 U/L $Remo'11  13  15   'YykZy$ Alk Phosphatase 38 - 126 U/L 37  38  45   Total Bilirubin 0.3 - 1.2 mg/dL 0.7  0.7  1.2      Past Medical History:  Diagnosis Date   Allergic rhinitis    Peptic ulcer disease     Past Surgical History:  Procedure Laterality Date   BIOPSY  02/11/2022   Procedure: BIOPSY;  Surgeon: Daryel November, MD;  Location: [facility];  Service: Gastroenterology;;   ESOPHAGOGASTRODUODENOSCOPY (EGD) WITH PROPOFOL N/A 02/11/2022   Procedure: ESOPHAGOGASTRODUODENOSCOPY (EGD) WITH PROPOFOL;  Surgeon: Daryel November, MD;  Location: [facility];  Service: Gastroenterology;  Laterality: N/A;   NO PAST SURGERIES      Current Medications, Allergies, Family History and Social History were reviewed in [employer] record.     Current Outpatient Medications  Medication Sig Dispense Refill   Multiple Vitamin (MULTI-VITAMIN DAILY PO) Take 1 tablet by mouth daily.     pantoprazole (PROTONIX) 40 MG tablet Take 1 tablet (40 mg total) by mouth 2 (two) times daily. 60 tablet 1   pantoprazole (PROTONIX) 40 MG tablet 1 tablet     sucralfate (CARAFATE) 1 g  tablet Take 1 tablet (1 g total) by mouth 4 (four) times daily for 14 days. 56 tablet 0   sucralfate (CARAFATE) 1 g tablet 1 tablet     No current facility-administered medications for this visit.    Review of Systems: No chest pain. No shortness of breath. No urinary complaints.    Physical Exam  Wt Readings from Last 3 Encounters:  02/27/22 88 lb (39.9 kg)  02/12/22 88 lb 10 oz (40.2 kg)  07/20/18 97 lb (44 kg)    Ht $R'4\' 10"'vo$  (1.473 m)   Wt 88 lb (39.9 kg)   BMI 18.39 kg/m  Constitutional:  Generally well appearing female in no acute distress. Psychiatric: Pleasant. Normal mood and affect. Behavior is normal. EENT: Pupils normal.  Conjunctivae are normal. No scleral icterus. Neck supple.  Cardiovascular: Normal rate, regular rhythm. No edema Pulmonary/chest: Effort normal and breath sounds normal. No wheezing, rales or rhonchi. Abdominal: Soft, nondistended, nontender. Bowel sounds active throughout. There are no masses palpable. No hepatomegaly. Neurological: Alert and oriented to person place and time. Skin: Skin is warm  and dry. No rashes noted.  Tye Savoy, NP  02/27/2022, 1:30 PM

## 2022-03-03 ENCOUNTER — Telehealth: Payer: Self-pay

## 2022-04-11 ENCOUNTER — Telehealth: Payer: Self-pay | Admitting: Nurse Practitioner

## 2022-04-11 NOTE — Telephone Encounter (Signed)
Patient called requesting a refill of pantoprazole to be sent to [facility] on [address] in [place].  She will run out Monday evening.  Please call patient and advise.

## 2022-04-14 ENCOUNTER — Other Ambulatory Visit: Payer: Self-pay

## 2022-04-14 NOTE — Telephone Encounter (Signed)
Rx refilled and once a day dosing as instructed in the patients office visit with P.Guenther

## 2022-04-15 ENCOUNTER — Other Ambulatory Visit: Payer: Self-pay

## 2022-04-15 ENCOUNTER — Telehealth: Payer: Self-pay | Admitting: Nurse Practitioner

## 2022-04-15 MED ORDER — PANTOPRAZOLE SODIUM 40 MG PO TBEC: 40.0000 mg | DELAYED_RELEASE_TABLET | Freq: Two times a day (BID) | ORAL | 1 refills | Status: DC

## 2022-04-24 NOTE — Telephone Encounter (Signed)
error 

## 2022-11-07 ENCOUNTER — Other Ambulatory Visit: Payer: Self-pay | Admitting: Family Medicine

## 2022-11-07 ENCOUNTER — Other Ambulatory Visit (HOSPITAL_COMMUNITY)
Admission: RE | Admit: 2022-11-07 | Discharge: 2022-11-07 | Disposition: A | Discharge status: Home / Self Care | Payer: 59 | Source: Ambulatory Visit | Attending: Family Medicine | Admitting: Family Medicine

## 2022-11-07 DIAGNOSIS — Z1231 Encounter for screening mammogram for malignant neoplasm of breast: Secondary | ICD-10-CM

## 2022-11-07 DIAGNOSIS — Z01411 Encounter for gynecological examination (general) (routine) with abnormal findings: Secondary | ICD-10-CM | POA: Diagnosis not present

## 2022-11-12 LAB — CYTOLOGY - PAP
Comment: NEGATIVE
Diagnosis: NEGATIVE
High risk HPV: NEGATIVE

## 2022-11-23 NOTE — Progress Notes (Signed)
Primary Physician/Referring:  Shon Hale, MD  Patient ID: Paula Ramos, female    DOB: 1965-11-20, 57 y.o.   MRN: [id number]  Chief Complaint  Patient presents with   DOE   New Patient (Initial Visit)    Referred by Dr. Chanetta Marshall   HPI:    Paula Ramos  is a 57 y.o. with a PMH significant for but not limited to iron deficiency anemia, GERD, bilateral renal hypoplasia, and PUD, who presents today to establish care and evaluation for dyspnea on exertion.   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 She states that she has a hard time performing her daily work task and can not climb stairs or walk long distances without getting short of breath. She states that she needs to sit down and take deep breaths to experience relief. She denies any smoking hx, rarely drinks alcohol, and endorses caffeine (2 cups coffee and soda). She is also noticing feeling more tired and fatigued. She is told that she snores but has not had a sleep study completed. She denies chest pain, palpitations, leg edema, orthopnea, PND, TIA/syncope.     Past Medical History:  Diagnosis Date   Allergic rhinitis    Peptic ulcer disease    Past Surgical History:  Procedure Laterality Date   BIOPSY  02/11/2022   Procedure: BIOPSY;  Surgeon: Jenel Lucks, MD;  Location: [facility];  Service: Gastroenterology;;   ESOPHAGOGASTRODUODENOSCOPY (EGD) WITH PROPOFOL N/A 02/11/2022   Procedure: ESOPHAGOGASTRODUODENOSCOPY (EGD) WITH PROPOFOL;  Surgeon: Jenel Lucks, MD;  Location: [facility];  Service: Gastroenterology;  Laterality: N/A;   NO PAST SURGERIES     Family History  Problem Relation Age of Onset   Kidney disease Mother    Colon cancer Neg Hx    Esophageal cancer Neg Hx    Rectal cancer Neg Hx    Stomach cancer Neg Hx     Social History   Tobacco Use   Smoking status: Never   Smokeless tobacco: Never  Substance Use Topics    Alcohol use: Yes    Alcohol/week: 2.0 standard drinks of alcohol    Types: 2 Cans of beer per week   Marital Status: Widowed  ROS  Review of Systems  Constitutional: Negative.  Cardiovascular:  Positive for dyspnea on exertion. Negative for chest pain, claudication, orthopnea, palpitations and paroxysmal nocturnal dyspnea.  Respiratory: Negative.    Musculoskeletal: Negative.   Neurological: Negative.   Psychiatric/Behavioral: Negative.     Objective  Blood pressure (!) 130/52, pulse (!) 57, resp. rate 16, height 4\' 10"  (1.473 m), weight 91 lb 12.8 oz (41.6 kg), SpO2 100 %. Body mass index is 19.19 kg/m.     11/24/2022    1:36 PM 02/27/2022    1:26 PM 02/12/2022   12:29 PM  Vitals with BMI  Height 4\' 10"  4\' 10"    Weight 91 lbs 13 oz 88 lbs   BMI 19.19 18.4   Systolic 130 90 132  Diastolic 52 60 60  Pulse 57 58 66     Physical Exam Constitutional:      Appearance: Normal appearance.  Cardiovascular:     Rate and Rhythm: Normal rate and regular rhythm.     Pulses: Normal pulses.     Heart sounds: Normal heart sounds. No murmur heard.    No gallop.  Pulmonary:     Effort: Pulmonary effort is normal.  Breath sounds: Normal breath sounds.  Abdominal:     Palpations: Abdomen is soft.  Musculoskeletal:        General: No swelling.     Right lower leg: No edema.     Left lower leg: No edema.  Skin:    General: Skin is warm and dry.  Neurological:     Mental Status: She is alert. Mental status is at baseline.  Psychiatric:        Mood and Affect: Mood normal.     Medications and allergies  No Known Allergies   Medication list after today's encounter   Current Outpatient Medications:    aspirin EC 81 MG tablet, Take 1 tablet (81 mg total) by mouth daily. Swallow whole., Disp: 90 tablet, Rfl: 3   ferrous sulfate 325 (65 FE) MG tablet, Take 325 mg by mouth daily with breakfast., Disp: , Rfl:    fluticasone (FLONASE) 50 MCG/ACT nasal spray, Place into both  nostrils daily., Disp: , Rfl:    metoprolol succinate (TOPROL XL) 25 MG 24 hr tablet, Take 0.5 tablets (12.5 mg total) by mouth daily., Disp: 90 tablet, Rfl: 3   Multiple Vitamin (MULTI-VITAMIN DAILY PO), Take 1 tablet by mouth daily., Disp: , Rfl:    Probiotic Product (DIGESTIVE ADVANTAGE GUMMIES) CHEW, Chew 1 tablet by mouth daily., Disp: , Rfl:    rosuvastatin (CRESTOR) 5 MG tablet, Take 1 tablet (5 mg total) by mouth at bedtime for 360 doses., Disp: 90 tablet, Rfl: 3  Laboratory examination:   Lab Results  Component Value Date   NA 140 02/12/2022   K 3.6 02/12/2022   CO2 25 02/12/2022   GLUCOSE 98 02/12/2022   BUN 17 02/12/2022   CREATININE 0.77 02/12/2022   CALCIUM 8.7 (L) 02/12/2022   EGFR 82 04/22/2021   GFRNONAA >60 02/12/2022       Latest Ref Rng & Units 02/12/2022    3:51 AM 02/11/2022    4:29 AM 02/10/2022    5:06 AM  CMP  Glucose 70 - 99 mg/dL 98  027  253   BUN 6 - 20 mg/dL 17  19  32   Creatinine 0.44 - 1.00 mg/dL 6.64  4.03  4.74   Sodium 135 - 145 mmol/L 140  142  142   Potassium 3.5 - 5.1 mmol/L 3.6  3.9  3.7   Chloride 98 - 111 mmol/L 107  109  108   CO2 22 - 32 mmol/L 25  28  29    Calcium 8.9 - 10.3 mg/dL 8.7  8.9  8.8   Total Protein 6.5 - 8.1 g/dL 5.6  5.8  6.1   Total Bilirubin 0.3 - 1.2 mg/dL 0.7  0.7  1.2   Alkaline Phos 38 - 126 U/L 37  38  45   AST 15 - 41 U/L 19  17  17    ALT 0 - 44 U/L 11  13  15        Latest Ref Rng & Units 02/12/2022    3:51 AM 02/11/2022    4:29 AM 02/10/2022    8:47 AM  CBC  WBC 4.0 - 10.5 K/uL 6.8  8.5    Hemoglobin 12.0 - 15.0 g/dL 9.0  9.4  9.4   Hematocrit 36.0 - 46.0 % 28.0  30.0  28.4   Platelets 150 - 400 K/uL 135  143      Lipid Panel No results for input(s): "CHOL", "TRIG", "LDLCALC", "VLDL", "HDL", "CHOLHDL", "LDLDIRECT" in the last 8760  hours.  HEMOGLOBIN A1C No results found for: "HGBA1C", "MPG" TSH 11/07/2022: TSH 3.68   External labs:  11/07/2022 Glucose 98, BUN/Cr 25 /0.78. EGFR 88. Na/K 142/4.3. Rest of  the CMP normal H/H 11.8/35.7. MCV 99. Platelets 222 Chol 169, TG 153, HDL 61, LDL 82   Radiology:    Cardiac Studies:   No results found for this or any previous visit from the past 1095 days.     No results found for this or any previous visit from the past 1095 days.     EKG:   11/24/2022: Sinus Rhythm, rate 64 bpm. Normal axis, no ischemia.  11/07/2022 EKG Interpretation   Ventricular Rate: 53   PR Interval:146   QRS Duration:  88 QT Interval:428   QTC Calculation:416   R Axis: 9    Text Interpretation: sinus brady - Garth Bigness, MD      Assessment     ICD-10-CM   1. Dyspnea on exertion  R06.09 EKG 12-Lead    PCV ECHOCARDIOGRAM COMPLETE    Ambulatory referral to Sleep Studies    PCV MYOCARDIAL PERFUSION WO LEXISCAN    2. Essential hypertension  I10     3. Mixed hyperlipidemia  E78.2        Orders Placed This Encounter  Procedures   Ambulatory referral to Sleep Studies    Referral Priority:   Routine    Referral Type:   Consultation    Referral Reason:   Specialty Services Required    Referred to Provider:   Melvyn Novas, MD    Number of Visits Requested:   1   PCV MYOCARDIAL PERFUSION WO LEXISCAN    Standing Status:   Future    Standing Expiration Date:   01/24/2023   EKG 12-Lead   PCV ECHOCARDIOGRAM COMPLETE    Standing Status:   Future    Standing Expiration Date:   11/24/2023    Meds ordered this encounter  Medications   metoprolol succinate (TOPROL XL) 25 MG 24 hr tablet    Sig: Take 0.5 tablets (12.5 mg total) by mouth daily.    Dispense:  90 tablet    Refill:  3   rosuvastatin (CRESTOR) 5 MG tablet    Sig: Take 1 tablet (5 mg total) by mouth at bedtime for 360 doses.    Dispense:  90 tablet    Refill:  3   aspirin EC 81 MG tablet    Sig: Take 1 tablet (81 mg total) by mouth daily. Swallow whole.    Dispense:  90 tablet    Refill:  3    Medications Discontinued During This Encounter  Medication Reason   pantoprazole  (PROTONIX) 40 MG tablet    pantoprazole (PROTONIX) 40 MG tablet      Recommendations:   YULEISY VALDIVIESO  is a 57 y.o. with a PMH significant for but not limited to iron deficiency anemia, GERD, bilateral renal hypoplasia, and PUD, who presents today to establish care and evaluation for dyspnea on exertion.    Dyspnea on Exertion Plan - Toprol XL 12.5 mg PO Daily - Crestor 5 mg PO HS - ASA 81 mg PO Daily - Schedule Echocardiogram  - Schedule Cardiac Stress Test   - Referral placed for Sleep Study  Please follow up in 6 weeks   Clotilde Dieter, DO, [facility]  11/25/2022, 2:02 PM Office: [phone] Pager: [phone]

## 2022-11-24 ENCOUNTER — Encounter: Payer: Self-pay | Admitting: Internal Medicine

## 2022-11-24 ENCOUNTER — Ambulatory Visit: Payer: 59 | Admitting: Internal Medicine

## 2022-11-24 VITALS — BP 130/52 | HR 57 | Resp 16 | Ht <= 58 in | Wt 91.8 lb

## 2022-11-24 DIAGNOSIS — I10 Essential (primary) hypertension: Secondary | ICD-10-CM

## 2022-11-24 DIAGNOSIS — E782 Mixed hyperlipidemia: Secondary | ICD-10-CM

## 2022-11-24 DIAGNOSIS — R0609 Other forms of dyspnea: Secondary | ICD-10-CM

## 2022-11-24 MED ORDER — ROSUVASTATIN CALCIUM 5 MG PO TABS: 5.0000 mg | ORAL_TABLET | Freq: Every evening | ORAL | 3 refills | Status: DC

## 2022-11-24 MED ORDER — ASPIRIN 81 MG PO TBEC: 81.0000 mg | DELAYED_RELEASE_TABLET | Freq: Every day | ORAL | 3 refills | Status: DC

## 2022-11-24 MED ORDER — METOPROLOL SUCCINATE ER 25 MG PO TB24: 12.5000 mg | ORAL_TABLET | Freq: Every day | ORAL | 3 refills | Status: DC

## 2022-12-01 ENCOUNTER — Ambulatory Visit: Payer: 59

## 2022-12-01 DIAGNOSIS — R0609 Other forms of dyspnea: Secondary | ICD-10-CM

## 2022-12-03 NOTE — Progress Notes (Signed)
LMTCB

## 2022-12-03 NOTE — Progress Notes (Signed)
Pt aware.

## 2022-12-03 NOTE — Progress Notes (Signed)
normal

## 2022-12-12 ENCOUNTER — Telehealth: Payer: Self-pay | Admitting: Cardiology

## 2022-12-12 NOTE — Telephone Encounter (Signed)
Answered her questions regarding the upcoming stress test on Monday, 11/14/2022.  No charges.  Tessa Lerner, [place], [facility]  Pager:  [phone] Office: [phone]

## 2022-12-15 ENCOUNTER — Ambulatory Visit: Payer: 59

## 2022-12-15 DIAGNOSIS — R0609 Other forms of dyspnea: Secondary | ICD-10-CM

## 2022-12-16 NOTE — Progress Notes (Signed)
Patient aware.

## 2022-12-16 NOTE — Progress Notes (Signed)
normal

## 2022-12-16 NOTE — Progress Notes (Signed)
Called patient no answer left a vm

## 2022-12-22 ENCOUNTER — Ambulatory Visit
Admission: RE | Admit: 2022-12-22 | Discharge: 2022-12-22 | Disposition: A | Discharge status: Home / Self Care | Payer: 59 | Source: Ambulatory Visit | Attending: Family Medicine | Admitting: Family Medicine

## 2022-12-22 DIAGNOSIS — Z1231 Encounter for screening mammogram for malignant neoplasm of breast: Secondary | ICD-10-CM

## 2023-01-05 ENCOUNTER — Ambulatory Visit: Payer: 59 | Admitting: Internal Medicine

## 2023-01-05 ENCOUNTER — Encounter: Payer: Self-pay | Admitting: Internal Medicine

## 2023-01-05 VITALS — BP 106/35 | HR 48 | Resp 16 | Ht <= 58 in | Wt 90.0 lb

## 2023-01-05 DIAGNOSIS — R0609 Other forms of dyspnea: Secondary | ICD-10-CM

## 2023-01-05 DIAGNOSIS — D508 Other iron deficiency anemias: Secondary | ICD-10-CM

## 2023-01-05 NOTE — Progress Notes (Signed)
Primary Physician/Referring:  Shon Hale, MD  Patient ID: Paula Ramos, female    DOB: Sep 25, 1965, 57 y.o.   MRN: [id number]  Chief Complaint  Patient presents with   Dyspnea on exertion   Follow-up   Results   HPI:    Paula Ramos  is a 57 y.o. with a PMH significant for but not limited to iron deficiency anemia, GERD, bilateral renal hypoplasia, and PUD, who presents for follow-up visit. She is happy her cardiac imaging came back normal. She has an appointment with sleep medicine tomorrow. No cardiac complaints or concerns at this time. She denies chest pain, palpitations, leg edema, orthopnea, PND, TIA/syncope.     Past Medical History:  Diagnosis Date   Allergic rhinitis    Peptic ulcer disease    Past Surgical History:  Procedure Laterality Date   BIOPSY  02/11/2022   Procedure: BIOPSY;  Surgeon: Jenel Lucks, MD;  Location: [facility];  Service: Gastroenterology;;   ESOPHAGOGASTRODUODENOSCOPY (EGD) WITH PROPOFOL N/A 02/11/2022   Procedure: ESOPHAGOGASTRODUODENOSCOPY (EGD) WITH PROPOFOL;  Surgeon: Jenel Lucks, MD;  Location: [facility];  Service: Gastroenterology;  Laterality: N/A;   NO PAST SURGERIES     Family History  Problem Relation Age of Onset   Kidney disease Mother    Colon cancer Neg Hx    Esophageal cancer Neg Hx    Rectal cancer Neg Hx    Stomach cancer Neg Hx     Social History   Tobacco Use   Smoking status: Never   Smokeless tobacco: Never  Substance Use Topics   Alcohol use: Yes    Alcohol/week: 2.0 standard drinks of alcohol    Types: 2 Cans of beer per week   Marital Status: Widowed  ROS  Review of Systems  Constitutional: Negative.  Cardiovascular:  Positive for dyspnea on exertion. Negative for chest pain, claudication, orthopnea, palpitations and paroxysmal nocturnal dyspnea.  Respiratory: Negative.    Musculoskeletal: Negative.   Neurological: Negative.   Psychiatric/Behavioral: Negative.     Objective   Blood pressure (!) 106/35, pulse (!) 48, resp. rate 16, height 4\' 10"  (1.473 m), weight 90 lb (40.8 kg), SpO2 99 %. Body mass index is 18.81 kg/m.     01/05/2023    1:07 PM 11/24/2022    1:36 PM 02/27/2022    1:26 PM  Vitals with BMI  Height 4\' 10"  4\' 10"  4\' 10"   Weight 90 lbs 91 lbs 13 oz 88 lbs  BMI 18.82 19.19 18.4  Systolic 106 130 90  Diastolic 35 52 60  Pulse 48 57 58     Physical Exam Constitutional:      Appearance: Normal appearance.  Cardiovascular:     Rate and Rhythm: Normal rate and regular rhythm.     Pulses: Normal pulses.     Heart sounds: Normal heart sounds. No murmur heard.    No gallop.  Pulmonary:     Effort: Pulmonary effort is normal.     Breath sounds: Normal breath sounds.  Abdominal:     Palpations: Abdomen is soft.  Musculoskeletal:        General: No swelling.     Right lower leg: No edema.     Left lower leg: No edema.  Skin:    General: Skin is warm and dry.  Neurological:     Mental Status: She is alert. Mental status is at baseline.  Psychiatric:        Mood and Affect: Mood normal.  Medications and allergies  No Known Allergies   Medication list after today's encounter   Current Outpatient Medications:    ferrous sulfate 325 (65 FE) MG tablet, Take 325 mg by mouth daily with breakfast., Disp: , Rfl:    Multiple Vitamin (MULTI-VITAMIN DAILY PO), Take 1 tablet by mouth daily., Disp: , Rfl:    Probiotic Product (DIGESTIVE ADVANTAGE GUMMIES) CHEW, Chew 1 tablet by mouth daily., Disp: , Rfl:    rosuvastatin (CRESTOR) 5 MG tablet, Take 1 tablet (5 mg total) by mouth at bedtime for 360 doses., Disp: 90 tablet, Rfl: 3  Laboratory examination:   Lab Results  Component Value Date   NA 140 02/12/2022   K 3.6 02/12/2022   CO2 25 02/12/2022   GLUCOSE 98 02/12/2022   BUN 17 02/12/2022   CREATININE 0.77 02/12/2022   CALCIUM 8.7 (L) 02/12/2022   EGFR 82 04/22/2021   GFRNONAA >60 02/12/2022       Latest Ref Rng & Units 02/12/2022     3:51 AM 02/11/2022    4:29 AM 02/10/2022    5:06 AM  CMP  Glucose 70 - 99 mg/dL 98  161  096   BUN 6 - 20 mg/dL 17  19  32   Creatinine 0.44 - 1.00 mg/dL 0.45  4.09  8.11   Sodium 135 - 145 mmol/L 140  142  142   Potassium 3.5 - 5.1 mmol/L 3.6  3.9  3.7   Chloride 98 - 111 mmol/L 107  109  108   CO2 22 - 32 mmol/L 25  28  29    Calcium 8.9 - 10.3 mg/dL 8.7  8.9  8.8   Total Protein 6.5 - 8.1 g/dL 5.6  5.8  6.1   Total Bilirubin 0.3 - 1.2 mg/dL 0.7  0.7  1.2   Alkaline Phos 38 - 126 U/L 37  38  45   AST 15 - 41 U/L 19  17  17    ALT 0 - 44 U/L 11  13  15        Latest Ref Rng & Units 02/12/2022    3:51 AM 02/11/2022    4:29 AM 02/10/2022    8:47 AM  CBC  WBC 4.0 - 10.5 K/uL 6.8  8.5    Hemoglobin 12.0 - 15.0 g/dL 9.0  9.4  9.4   Hematocrit 36.0 - 46.0 % 28.0  30.0  28.4   Platelets 150 - 400 K/uL 135  143      Lipid Panel No results for input(s): "CHOL", "TRIG", "LDLCALC", "VLDL", "HDL", "CHOLHDL", "LDLDIRECT" in the last 8760 hours.  HEMOGLOBIN A1C No results found for: "HGBA1C", "MPG" TSH 11/07/2022: TSH 3.68   External labs:  11/07/2022 Glucose 98, BUN/Cr 25 /0.78. EGFR 88. Na/K 142/4.3. Rest of the CMP normal H/H 11.8/35.7. MCV 99. Platelets 222 Chol 169, TG 153, HDL 61, LDL 82   Radiology:    Cardiac Studies:   Exercise nuclear stress test 12/15/2022 Myocardial perfusion is normal. Overall LV systolic function is normal without regional wall motion abnormalities. Stress LV EF: 73%. Low risk study, Abnormal ECG stress. The patient exercised for 9 minutes and 0 seconds of a Bruce protocol, achieving approximately 10.51 METs and 87% MPHR. Peak EKG/ECG demonstrated sinus tachycardia. The heart rate response was normal. The blood pressure response was normal. No previous exam available for comparison.  Echocardiogram 12/01/2022: Left ventricle cavity is normal in size and wall thickness. Normal global wall motion. Normal LV systolic function with EF 64%.  Normal  diastolic filling pattern. Mild (Grade I) mitral regurgitation. No evidence of pulmonary hypertension.    EKG:   11/24/2022: Sinus Rhythm, rate 64 bpm. Normal axis, no ischemia.  11/07/2022 EKG Interpretation   Ventricular Rate: 53   PR Interval:146   QRS Duration:  88 QT Interval:428   QTC Calculation:416   R Axis: 9    Text Interpretation: sinus brady - Garth Bigness, MD      Assessment     ICD-10-CM   1. Dyspnea on exertion  R06.09     2. Other iron deficiency anemia  D50.8        No orders of the defined types were placed in this encounter.   No orders of the defined types were placed in this encounter.   Medications Discontinued During This Encounter  Medication Reason   fluticasone (FLONASE) 50 MCG/ACT nasal spray    aspirin EC 81 MG tablet    metoprolol succinate (TOPROL XL) 25 MG 24 hr tablet      Recommendations:   ANAIRIS KNICK  is a 57 y.o. with a PMH significant for but not limited to iron deficiency anemia, GERD, bilateral renal hypoplasia, and PUD.   Dyspnea on Exertion Plan - echo and stress test within normal limits - Referral placed for Sleep Study  Please follow up PRN   Clotilde Dieter, DO, [facility]  01/05/2023, 1:48 PM Office: [phone] Pager: [phone]

## 2023-01-06 ENCOUNTER — Encounter: Payer: Self-pay | Admitting: Neurology

## 2023-01-06 ENCOUNTER — Ambulatory Visit: Payer: 59 | Admitting: Neurology

## 2023-01-06 VITALS — BP 81/39 | HR 58 | Ht 59.0 in | Wt 91.2 lb

## 2023-01-06 DIAGNOSIS — R0683 Snoring: Secondary | ICD-10-CM | POA: Insufficient documentation

## 2023-01-06 DIAGNOSIS — D649 Anemia, unspecified: Secondary | ICD-10-CM | POA: Insufficient documentation

## 2023-01-06 DIAGNOSIS — J301 Allergic rhinitis due to pollen: Secondary | ICD-10-CM

## 2023-01-06 DIAGNOSIS — D508 Other iron deficiency anemias: Secondary | ICD-10-CM

## 2023-01-06 DIAGNOSIS — R0609 Other forms of dyspnea: Secondary | ICD-10-CM | POA: Insufficient documentation

## 2023-01-06 NOTE — Patient Instructions (Signed)
Anemia  Anemia is a condition in which there are not enough red blood cells or hemoglobin in the blood. Hemoglobin is a substance in red blood cells that carries oxygen. When you do not have enough red blood cells or hemoglobin (are anemic), your body cannot get enough oxygen, and your organs may not work properly. As a result, you may feel very tired or have other problems. What are the causes? Common causes of anemia include: Excessive bleeding. Anemia can be caused by excessive bleeding inside or outside the body, including bleeding from the intestines or from heavy menstrual periods in females. Poor nutrition. Long-lasting (chronic) kidney, thyroid, and liver disease. Bone marrow disorders, spleen problems, and blood disorders. Cancer and treatments for cancer. Human immunodeficiency virus (HIV) and acquired immunodeficiency syndrome (AIDS). Infections, medicines, and autoimmune disorders that destroy red blood cells. What are the signs or symptoms? Symptoms of this condition include: Minor weakness. Dizziness. Headache, or difficulties concentrating and sleeping. Heartbeats that feel irregular or faster than normal (palpitations). Shortness of breath, especially with exercise. Pale skin, lips, and nails, or cold hands and feet. Upset stomach (indigestion) and nausea. Symptoms may occur suddenly or develop slowly. If your anemia is mild, you may not have symptoms. How is this diagnosed? This condition is diagnosed based on blood tests, your medical history, and a physical exam. In some cases, a test may be needed in which cells are removed from the soft tissue inside of a bone and looked at under a microscope (bone marrow biopsy). Your health care provider may also check your stool (feces) for blood and may do more testing to look for the cause of your bleeding. Other tests may include: Imaging tests, such as a CT scan or MRI. A procedure to see inside your esophagus and stomach  (endoscopy). The esophagus is the part of the body that moves food from your mouth to your stomach. A procedure to see inside your colon and rectum (colonoscopy). How is this treated? Treatment for this condition depends on the cause. If you continue to lose a lot of blood, you may need to be treated at a hospital. Treatment may include: Taking supplements of iron, vitamin B12, or folic acid. Taking a hormone medicine (erythropoietin) that can help to stimulate red blood cell growth. Receiving donated blood through an IV (blood transfusion). This may be needed if you lose a lot of blood. Making changes to your diet. Having surgery to remove your spleen. Follow these instructions at home: Take over-the-counter and prescription medicines only as told by your health care provider. Take supplements only as told by your health care provider. Follow any diet instructions that you were given by your health care provider. Keep all follow-up visits. Your health care provider will want to recheck your blood tests. Contact a health care provider if: You develop new bleeding anywhere in the body. You are very weak. Get help right away if: You are short of breath. You have pain in your abdomen or chest. You are dizzy or feel faint. You have trouble concentrating. You have bloody stools, black stools, or tarry stools. You vomit repeatedly or you vomit up blood. These symptoms may be an emergency. Get help right away. Call 911. Do not wait to see if the symptoms will go away. Do not drive yourself to the hospital. Summary Anemia is a condition in which you do not have enough red blood cells or enough of a substance in your red blood cells that carries  oxygen. Symptoms may occur suddenly or develop slowly. If your anemia is mild, you may not have symptoms. This condition is diagnosed with blood tests, a medical history, and a physical exam. Other tests may be needed. Treatment for this condition  depends on the cause of the anemia. This information is not intended to replace advice given to you by your health care provider. Make sure you discuss any questions you have with your health care provider. Document Revised: 11/18/2021 Document Reviewed: 11/18/2021 Elsevier Patient Education  2023 Elsevier Inc. Quality Sleep Information, Adult Quality sleep is important for your mental and physical health. It also improves your quality of life. Quality sleep means you: Are asleep for most of the time you are in bed. Fall asleep within 30 minutes. Wake up no more than once a night. Are awake for no longer than 20 minutes if you do wake up during the night. Most adults need 7-8 hours of quality sleep each night. How can poor sleep affect me? If you do not get enough quality sleep, you may have: Mood swings. Daytime sleepiness. Decreased alertness, reaction time, and concentration. Sleep disorders, such as insomnia and sleep apnea. Difficulty with: Solving problems. Coping with stress. Paying attention. These issues may affect your performance and productivity at work, school, and home. Lack of sleep may also put you at higher risk for accidents, suicide, and risky behaviors. If you do not get quality sleep, you may also be at higher risk for several health problems, including: Infections. Type 2 diabetes. Heart disease. High blood pressure. Obesity. Worsening of long-term conditions, like arthritis, kidney disease, depression, Parkinson's disease, and epilepsy. What actions can I take to get more quality sleep? Sleep schedule and routine Stick to a sleep schedule. Go to sleep and wake up at about the same time each day. Do not try to sleep less on weekdays and make up for lost sleep on weekends. This does not work. Limit naps during the day to 30 minutes or less. Do not take naps in the late afternoon. Make time to relax before bed. Reading, listening to music, or taking a hot bath  promotes quality sleep. Make your bedroom a place that promotes quality sleep. Keep your bedroom dark, quiet, and at a comfortable room temperature. Make sure your bed is comfortable. Avoid using electronic devices that give off bright blue light for 30 minutes before bedtime. Your brain perceives bright blue light as sunlight. This includes television, phones, and computers. If you are lying awake in bed for longer than 20 minutes, get up and do a relaxing activity until you feel sleepy. Lifestyle     Try to get at least 30 minutes of exercise on most days. Do not exercise 2-3 hours before going to bed. Do not use any products that contain nicotine or tobacco. These products include cigarettes, chewing tobacco, and vaping devices, such as e-cigarettes. If you need help quitting, ask your health care provider. Do not drink caffeinated beverages for at least 8 hours before going to bed. Coffee, tea, and some sodas contain caffeine. Do not drink alcohol or eat large meals close to bedtime. Try to get at least 30 minutes of sunlight every day. Morning sunlight is best. Medical concerns Work with your health care provider to treat medical conditions that may affect sleeping, such as: Nasal obstruction. Snoring. Sleep apnea and other sleep disorders. Talk to your health care provider if you think any of your prescription medicines may cause you to have difficulty  falling or staying asleep. If you have sleep problems, talk with a sleep consultant. If you think you have a sleep disorder, talk with your health care provider about getting evaluated by a specialist. Where to find more information Sleep Foundation: sleepfoundation.org American Academy of Sleep Medicine: aasm.org Centers for Disease Control and Prevention (CDC): TonerPromos.no Contact a health care provider if: You have trouble getting to sleep or staying asleep. You often wake up very early in the morning and cannot get back to sleep. You  have daytime sleepiness. You have daytime sleep attacks of suddenly falling asleep and sudden muscle weakness (narcolepsy). You have a tingling sensation in your legs with a strong urge to move your legs (restless legs syndrome). You stop breathing briefly during sleep (sleep apnea). You think you have a sleep disorder or are taking a medicine that is affecting your quality of sleep. Summary Most adults need 7-8 hours of quality sleep each night. Getting enough quality sleep is important for your mental and physical health. Make your bedroom a place that promotes quality sleep, and avoid things that may cause you to have poor sleep, such as alcohol, caffeine, smoking, or large meals. Talk to your health care provider if you have trouble falling asleep or staying asleep. This information is not intended to replace advice given to you by your health care provider. Make sure you discuss any questions you have with your health care provider. Document Revised: 12/18/2021 Document Reviewed: 12/18/2021 Elsevier Patient Education  2023 [employer].

## 2023-01-06 NOTE — Progress Notes (Signed)
SLEEP MEDICINE CLINIC    Provider:  Melvyn Novas, MD  Primary Care Physician:  Shon Hale, MD [address] [place] [address]     Referring Provider: Clotilde Dieter, Do [address] [place],  [place] [address]          Chief Complaint according to patient   Patient presents with:     New Patient (Initial Visit)           HISTORY OF PRESENT ILLNESS:  Paula Ramos is a 57 y.o. female patient who is seen upon referral on 01/06/2023 from Dr Melton Alar  for a Sleep consultation.  Chief concern according to patient :  I get SOB and I go to bed early, I am fatigued"   I have the pleasure of seeing Paula Ramos [birth year]23 a right -handed female with a possible sleep disorder.     Sleep relevant medical history:  SOB, negative stress test- 1-2 Nocturia, no Sleep walking, allergy related, rhinitis,  postnasal drip,  sinuitis.  The patient was seen here today upon request by Dr. Melton Alar, who had done a cardiology workup for this patient between her birthday and [birth year] November 2022.  Paula Ramos was here introduced as a 57 year old Caucasian female with a past history significant for iron deficiency anemia, GERD, renal hypoplasia, PUD who presented to cardiology to be evaluated for dyspnea on exertion.  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  She has overall felt more tired and fatigued.   She has been told that she snores but she has not had a reliable witness in a while.  She does not have leg edema, she has no chest pain or tightness and she has no fainting spells/ mini strokes /or mini seizures in her past.   Family medical /sleep history: No other family  member on CPAP with OSA, insomnia, sleep walkers.    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  Tobacco use; none .  ETOH use 'rarely,  Caffeine intake in form of Coffee( 1-2 cups a day) Soda( 1 can in Pm ) Tea ( - if not  drinking cafe) or energy drinks. Exercise in form of walking .  SOB with exertion but cardiac work-up was clear.  Hobbies :none  Paula Ramos was here introduced as a 57 year old Caucasian female with a past history significant for iron deficiency anemia, GERD, renal hypoplasia, PUD who presented to cardiology to be evaluated for dyspnea on exertion.  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  She has overall felt more tired and fatigued.  She has been told that she snores but she has not had a reliable witness in a while.  She does not have leg edema, she  has no chest pain or tightness and she has no fainting spells mini strokes or mini seizures in her past.     Sleep habits are as follows: The patient's dinner time is between 4.30-5 PM. The patient goes to bed at 9 PM, has trouble to stay awake-  and continues to sleep for 6-7 hours, wakes for one  bathroom breaks, the first time at 12 AM.   The preferred sleep position is laterally, with the support of  1-2 pillows.  Dreams are reportedly rare-  The patient wakes up spontaneously before the set alarm at 5 Am and 5.30 AM . 5.30  AM is the usual rise time. She reports feeling refreshed or restored in AM,  with symptoms of postnasal drip, and residual fatigue.  Naps are taken rarely- than nocturnal sleep.    Review of Systems: Out of a complete 14 system  review, the patient complains of only the following symptoms, and all other reviewed systems are negative.:  Fatigue, sleepiness , snoring, fragmented sleep.   How likely are you to doze in the following situations: 0 = not likely, 1 = slight chance, 2 = moderate chance, 3 = high chance   Sitting and Reading? Watching Television? Sitting inactive in a public place (theater or meeting)? As a passenger in a car for an hour without a break? Lying down in the afternoon when circumstances permit? Sitting and talking to someone? Sitting quietly after lunch without alcohol? In a car, while stopped for a few minutes in traffic?   Total = 7/ 24 points   FSS endorsed at 43/ 63 points.   Social History   Socioeconomic History   Marital status: Widowed    Spouse name: Not on file   Number of children: 0   Years of education: Not on file   Highest education level: Not on file  Occupational History   Not on file  Tobacco Use   Smoking status: Never   Smokeless tobacco: Never  Vaping Use   Vaping Use: Never used  Substance and Sexual Activity   Alcohol use: Yes    Alcohol/week: 2.0 standard drinks of alcohol    Types: 2 Cans of beer per week   Drug use: No   Sexual activity: Not on file  Other Topics Concern   Not on file  Social History Narrative   Not on file   Social Determinants of Health   Financial Resource Strain: Not on file  Food Insecurity: Not on file  Transportation Needs: Not on file  Physical Activity: Not on file  Stress: Not on file  Social Connections: Not on file    Family History ; no siblings. No children   Problem Relation Age of Onset   Kidney disease Mother 2005    Prostate cancer  Father 2022,    Esophageal cancer Neg Hx    Rectal cancer Neg Hx    Stomach cancer Neg Hx     Past Medical History:  Diagnosis Date   Allergic rhinitis    Peptic ulcer disease     Past Surgical History:  Procedure Laterality Date   BIOPSY  02/11/2022    Procedure: BIOPSY;  Surgeon: Jenel Lucks, MD;  Location: [facility];  Service: Gastroenterology;;   ESOPHAGOGASTRODUODENOSCOPY (EGD) WITH PROPOFOL N/A 02/11/2022   Procedure: ESOPHAGOGASTRODUODENOSCOPY (EGD) WITH PROPOFOL;  Surgeon: Jenel Lucks, MD;  Location: [facility];  Service: Gastroenterology;  Laterality: N/A;   NO PAST SURGERIES  Current Outpatient Medications on File Prior to Visit  Medication Sig Dispense Refill   ferrous sulfate 325 (65 FE) MG tablet Take 325 mg by mouth daily with breakfast.     Multiple Vitamin (MULTI-VITAMIN DAILY PO) Take 1 tablet by mouth daily.     Probiotic Product (DIGESTIVE ADVANTAGE GUMMIES) CHEW Chew 1 tablet by mouth daily.     rosuvastatin (CRESTOR) 5 MG tablet Take 1 tablet (5 mg total) by mouth at bedtime for 360 doses. 90 tablet 3   No current facility-administered medications on file prior to visit.    No Known Allergies   DIAGNOSTIC DATA (LABS, IMAGING, TESTING) - I reviewed patient records, labs, notes, testing and imaging myself where available.  Lab Results  Component Value Date   WBC 6.8 02/12/2022   HGB 9.0 (L) 02/12/2022   HCT 28.0 (L) 02/12/2022   MCV 103.7 (H) 02/12/2022   PLT 135 (L) 02/12/2022      Component Value Date/Time   NA 140 02/12/2022 0351   NA 145 (H) 04/22/2021 1224   K 3.6 02/12/2022 0351   CL 107 02/12/2022 0351   CO2 25 02/12/2022 0351   GLUCOSE 98 02/12/2022 0351   BUN 17 02/12/2022 0351   BUN 15 04/22/2021 1224   CREATININE 0.77 02/12/2022 0351   CALCIUM 8.7 (L) 02/12/2022 0351   PROT 5.6 (L) 02/12/2022 0351   PROT 7.3 04/22/2021 1224   ALBUMIN 3.0 (L) 02/12/2022 0351   ALBUMIN 5.1 (H) 04/22/2021 1224   AST 19 02/12/2022 0351   ALT 11 02/12/2022 0351   ALKPHOS 37 (L) 02/12/2022 0351   BILITOT 0.7 02/12/2022 0351   BILITOT 0.3 04/22/2021 1224   GFRNONAA >60 02/12/2022 0351   GFRAA >60 06/07/2018 2151   No results found for: "CHOL", "HDL", "LDLCALC", "LDLDIRECT", "TRIG",  "CHOLHDL" No results found for: "HGBA1C" Lab Results  Component Value Date   VITAMINB12 214 02/10/2022   No results found for: "TSH"  PHYSICAL EXAM:  Today's Vitals   01/06/23 1457  BP: (!) 81/39  Pulse: (!) 58  Weight: 91 lb 3.2 oz (41.4 kg)  Height: 4\' 11"  (1.499 m)   Body mass index is 18.42 kg/m.   Wt Readings from Last 3 Encounters:  01/06/23 91 lb 3.2 oz (41.4 kg)  01/05/23 90 lb (40.8 kg)  11/24/22 91 lb 12.8 oz (41.6 kg)     Ht Readings from Last 3 Encounters:  01/06/23 4\' 11"  (1.499 m)  01/05/23 4\' 10"  (1.473 m)  11/24/22 4\' 10"  (1.473 m)      General: The patient is awake, alert and appears not in acute distress. The patient is well groomed. Head: Normocephalic, atraumatic. Neck is supple.  Mallampati 1,  neck circumference:11 inches .  Nasal airflow  patent.  Retrognathia is not seen.  Dental status: biological  Cardiovascular:  Regular rate and cardiac rhythm by pulse,  without distended neck veins. Respiratory: Lungs are clear to auscultation.  Skin:  Without evidence of ankle edema, or rash. Trunk: The patient's posture is erect.   NEUROLOGIC EXAM: The patient is awake and alert, oriented to place and time.   Memory subjective described as intact.  Attention span & concentration ability appears normal.  Speech is fluent,  without  dysarthria, dysphonia or aphasia.  Mood and affect are appropriate.   Cranial nerves: no loss of smell or taste reported  Pupils are equal and briskly reactive to light. Funduscopic exam deferred.  Extraocular movements in vertical and horizontal planes were intact  and without nystagmus. No Diplopia. Visual fields by finger perimetry are intact. Hearing was intact to soft voice and finger rubbing.    Facial sensation intact to fine touch.  Facial motor strength is symmetric and tongue and uvula move midline.  Neck ROM : rotation, tilt and flexion extension were normal for age and shoulder shrug was symmetrical.     Motor exam:  Symmetric bulk, tone and ROM.   Normal tone without cog wheeling, symmetric grip strength .   Sensory:  Fine touch and vibration were  intact.  Proprioception tested in the upper extremities was normal.   Coordination: Rapid alternating movements in the fingers/hands were of normal speed.  The Finger-to-nose maneuver was intact without evidence of ataxia, dysmetria or tremor.   Gait and station: Patient could rise unassisted from a seated position, walked without assistive device.  Stance is of normal width/ base and the patient turned with 3 steps.  Toe and heel walk were deferred.  Deep tendon reflexes: in the  upper and lower extremities are symmetric and intact.  Babinski response was deferred.    ASSESSMENT AND PLAN 57 y.o. year old female  here with:    1) the patient did not endorse a high degree of daytime sleepiness with the Epworth score at 7 points out of 24 that her fatigue degree is high fatigue severity was 43 out of 63 points.  She has a history of gastric ulcer, iron deficiency anemia, she has no surgical history.  Overall she feels a decrease in energy but also some changes in appetite -plus it is allergy season and she does have allergic rhinitis.    My goal is to evaluate the patient - However with [place] healthcare coverage her insurance will ask insist on a home sleep test and we will have to do that first.  My goal is to rule out hypoxia at night apnea, I will also be able to see bradycardia or tachycardia but I cannot distinguish a cardiac rhythm from this home sleep test device.    HST for hypoxia, apnea.  I plan to follow up either personally or through our NP within 2-5 months.   I would like to thank  Custovic, Rozell Searing, Do [address] [place],  [place] [address] for allowing me to meet with and to take care of this pleasant patient.   CC:  After spending a total time of  25  minutes face to face and additional time for physical and  neurologic examination, review of laboratory studies,  personal review of imaging studies, reports and results of other testing and review of referral information / records as far as provided in visit,   Electronically signed by: Melvyn Novas, MD 01/06/2023 3:28 PM  [facility] and [facility] Sleep Board certified by The [employer] of Sleep Medicine and Diplomate of the [employer] of Sleep Medicine. Board certified In Neurology through the ABPN, Fellow of the [employer] of Neurology. Medical Director of [employer].

## 2023-01-15 ENCOUNTER — Telehealth: Payer: Self-pay | Admitting: Neurology

## 2023-01-15 NOTE — Telephone Encounter (Signed)
HST- [employer] no auth req   Patient is scheduled at [facility] for 02/04/23 at 4 pm.  Mailed packet to the patient.

## 2023-02-04 ENCOUNTER — Ambulatory Visit (INDEPENDENT_AMBULATORY_CARE_PROVIDER_SITE_OTHER): Payer: 59 | Admitting: Neurology

## 2023-02-04 DIAGNOSIS — R0683 Snoring: Secondary | ICD-10-CM

## 2023-02-04 DIAGNOSIS — R0609 Other forms of dyspnea: Secondary | ICD-10-CM

## 2023-02-04 DIAGNOSIS — J301 Allergic rhinitis due to pollen: Secondary | ICD-10-CM | POA: Diagnosis not present

## 2023-02-04 DIAGNOSIS — D508 Other iron deficiency anemias: Secondary | ICD-10-CM | POA: Diagnosis not present

## 2023-02-04 DIAGNOSIS — D649 Anemia, unspecified: Secondary | ICD-10-CM

## 2023-02-05 NOTE — Progress Notes (Signed)
[facility] Paula Ramos Female, 57 y.o., 26-Jan-1966 MRN: [id number]   HOME SLEEP TEST REPORT ( by Watch PAT)   STUDY DATA :  02-05-2023   ORDERING CLINICIAN: Melvyn Novas, MD  REFERRING CLINICIAN: Dr, Melton Alar, DO Cardiology   CLINICAL INFORMATION/HISTORY: 01-06-2023: This slender 59 -year-old patient reports shortness of breath and high level fatigue, was worked up by Dr Melton Alar, but no cardiological cause was identified. Paula Ramos was here introduced as a 57 year old Caucasian female with a past history significant for iron deficiency anemia, GERD, renal hypoplasia, PUD who presented to cardiology to be evaluated for dyspnea on exertion.  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  She has overall felt more tired and fatigued.   She has been told that she snores but she has not had a reliable witness in a while.  She does not have leg edema, she has no chest pain or tightness and she has no fainting spells/ mini strokes /or mini seizures in her past. She has a history of GI ulcers, anemia. Plan: Work up for hypoxia, sleep apnea, screening test will be a HST.     Epworth sleepiness score: 7/24. FSS endorsed at 43/63 points.   BMI:  18 kg/m   Neck Circumference: 12"    Sleep Summary:   Total Recording Time (hours, min):   8 hours 6 minutes  Total Sleep Time (hours, min): 7 hours 6 minutes                 Percent REM (%):   Not enough to detect   /inconclusive REM sleep detection.                                  Respiratory Indices:   Calculated pAHI (per hour):     12/h                        REM pAHI / NREM pAHI:   These could not be differentiated.                            Positional AHI: 135 minutes were spent in left lateral sleep position associated with an AHI of 14.7, 182 minutes in supine position with an AHI of 12.5/h, and 109 minutes in the right lateral sleep position with an AHI of 6.7/h.  Snoring level reached a mean volume of 40 dB which is at threshold and very mild with only 8% of total sleep time involved.                                                  Oxygen Saturation Statistics:     O2 Saturation Range (%): Between a nadir at 92 and a maximum of 100% saturation with a mean saturation of 96%.  O2 Saturation (minutes) <89%: 0 minutes         Pulse Rate Statistics:   Pulse Mean (bpm):    49 bpm             Pulse Range: Between 32 and 72 bpm.               IMPRESSION:  This HST confirms the presence of mild and apparently obstructive sleep apnea that was present in the left lateral and supine sleep. Inconclusive REM sleep detection limits validity of the test results.    There was no associated hypoxia but bradycardia is clearly noted.  PS: The recording device started working only at about midnight which means that 2-1/2 hours of recording group with incomplete data, but the remainder of the night documented all channels for 4 hours or more of sleep.    RECOMMENDATION: I am concerned about the bradycardia finding which I cannot correlate to apneic events.   The overall apnea hypopnea index was low and with this kind of mild apnea in the absence of snoring I would only recommend to avoid sleeping on the back, avoid sleeping on the left, sleeping prone or right-sided sleep positions. CPAP would be optional, especially since this patient does not endorse true hypersomnia. I will also send the results to her primary care physician to investigate if anemia could still play a role here.  In short, I doubt that apnea is the cause of the patient's shortness of breath concern. I will however offer  to repeat the HST due to the above explained limitations.     INTERPRETING PHYSICIAN:   Paula Novas, MD / Silvestre Moment of [facility].

## 2023-02-18 ENCOUNTER — Telehealth: Payer: Self-pay | Admitting: Neurology

## 2023-02-18 NOTE — Telephone Encounter (Signed)
Please result when available  

## 2023-02-18 NOTE — Telephone Encounter (Signed)
Pt called wanting to know if there is an update on her HST results. Please advise.

## 2023-02-20 NOTE — Procedures (Signed)
        [facility] Paula Ramos Female, 57 y.o., 11/12/1965 MRN: [id number]   HOME SLEEP TEST REPORT ( by Watch PAT)   STUDY DATA :  02-05-2023   ORDERING CLINICIAN: Bernhard Koskinen, MD  REFERRING CLINICIAN: Dr, Custovic, DO Cardiology   CLINICAL INFORMATION/HISTORY: 01-06-2023: This slender 57 -year-old patient reports shortness of breath and high level fatigue, was worked up by Dr Custovic, but no cardiological cause was identified. Paula Ramos was here introduced as a 57-year-old Caucasian female with a past history significant for iron deficiency anemia, GERD, renal hypoplasia, PUD who presented to cardiology to be evaluated for dyspnea on exertion.  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  She has overall felt more tired and fatigued.   She has been told that she snores but she has not had a reliable witness in a while.  She does not have leg edema, she has no chest pain or tightness and she has no fainting spells/ mini strokes /or mini seizures in her past. She has a history of GI ulcers, anemia. Plan: Work up for hypoxia, sleep apnea, screening test will be a HST.     Epworth sleepiness score: 7/24. FSS endorsed at 43/63 points.   BMI:  18 kg/m   Neck Circumference: 12"    Sleep Summary:   Total Recording Time (hours, min):   8 hours 6 minutes  Total Sleep Time (hours, min): 7 hours 6 minutes                 Percent REM (%):   Not enough to detect   /inconclusive REM sleep detection.                                  Respiratory Indices:   Calculated pAHI (per hour):     12/h                        REM pAHI / NREM pAHI:   These could not be differentiated.                            Positional AHI: 135 minutes were spent in left lateral sleep position associated with an AHI of 14.7, 182 minutes in supine position with an AHI of 12.5/h, and 109 minutes in the right lateral sleep position with an AHI of 6.7/h.  Snoring level reached a mean volume of 40 dB which is at threshold and very mild with only 8% of total sleep time involved.                                                  Oxygen Saturation Statistics:     O2 Saturation Range (%): Between a nadir at 92 and a maximum of 100% saturation with a mean saturation of 96%.                                       O2 Saturation (minutes) <89%: 0 minutes         Pulse Rate Statistics:   Pulse Mean (bpm):    49 bpm             Pulse Range: Between 32 and 72 bpm.               IMPRESSION:  This HST confirms the presence of mild and apparently obstructive sleep apnea that was present in the left lateral and supine sleep. Inconclusive REM sleep detection limits validity of the test results.    There was no associated hypoxia but bradycardia is clearly noted.  PS: The recording device started working only at about midnight which means that 2-1/2 hours of recording group with incomplete data, but the remainder of the night documented all channels for 4 hours or more of sleep.    RECOMMENDATION: I am concerned about the bradycardia finding which I cannot correlate to apneic events.   The overall apnea hypopnea index was low and with this kind of mild apnea in the absence of snoring I would only recommend to avoid sleeping on the back, avoid sleeping on the left, sleeping prone or right-sided sleep positions. CPAP would be optional, especially since this patient does not endorse true hypersomnia. I will also send the results to her primary care physician to investigate if anemia could still play a role here.  In short, I doubt that apnea is the cause of the patient's shortness of breath concern. I will however offer  to repeat the HST due to the above explained limitations.     INTERPRETING PHYSICIAN:   Ridwan Bondy, MD / [facility] of [facility].                       

## 2023-03-03 NOTE — Telephone Encounter (Signed)
Pt stated she seen mychartt message from nurse. Stated she is still not clear on results and would like a call  from nurse.

## 2023-03-03 NOTE — Telephone Encounter (Signed)
Pt returned call, I reiterated the [facility] msg to her. She did not see my msg and saw Dr Dohmeier's result note. Pt understood and will wait for sleep lab to call her to schedule retest.

## 2023-03-03 NOTE — Telephone Encounter (Signed)
Contacted pt back, LVM rq call back  

## 2023-03-26 ENCOUNTER — Telehealth: Payer: Self-pay | Admitting: Neurology

## 2023-03-26 NOTE — Telephone Encounter (Signed)
03/19/23 LVM was left  03/03/23 LVM KS 02/23/23 [employer] no auth req EE

## 2023-07-07 IMAGING — CT CT ABD-PELV W/ CM
2 of 4 series · 16 of 46 positions shown, 18 images · IV contrast (OMNIPAQUE 300)
Comparison: 12/20/2021 renal ultrasound.  No comparison CT.

CLINICAL DATA: Left lower quadrant pain.  Emesis.

EXAM:
CT ABDOMEN AND PELVIS WITH CONTRAST
TECHNIQUE: Multidetector CT imaging of the abdomen and pelvis was performed
using the standard protocol following bolus administration of
intravenous contrast.

[Series 2: axial st · axial · 0.82mm/px · z∈[+1144,+1500]mm · 13 of 79 slices shown, 15 images]
[im 4/79  soft-tissue]
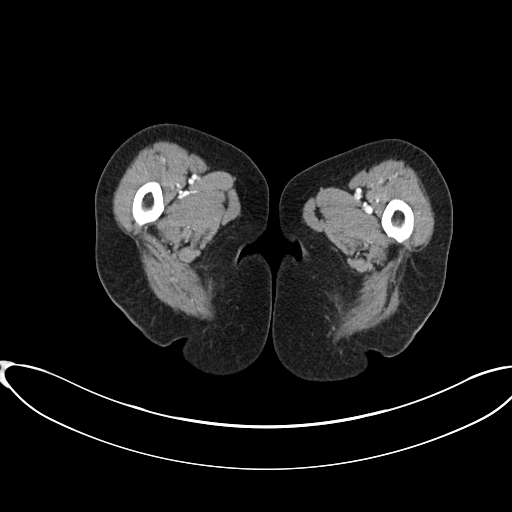
[im 4/79  bone]
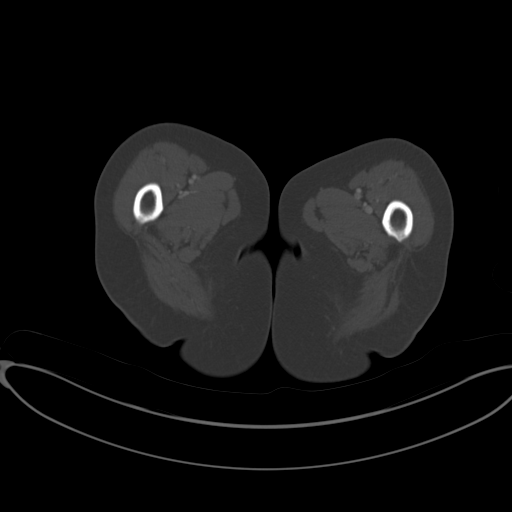
[im 11/79  soft-tissue]
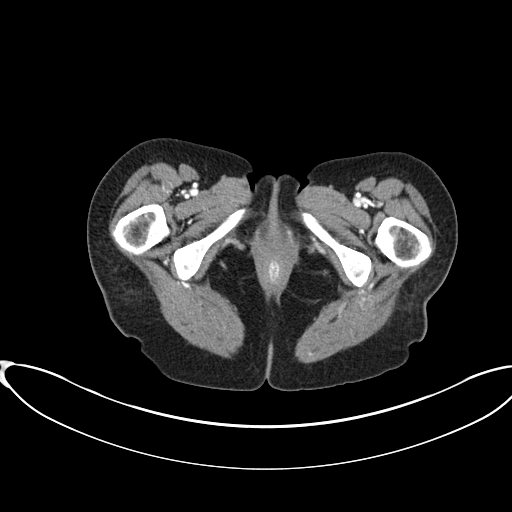
[im 18/79  soft-tissue]
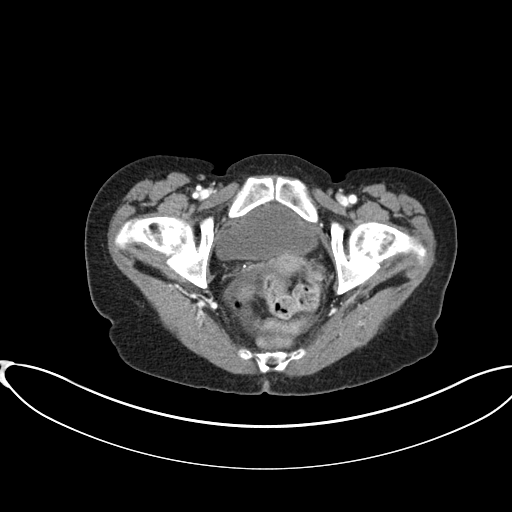
[im 22/79  soft-tissue]
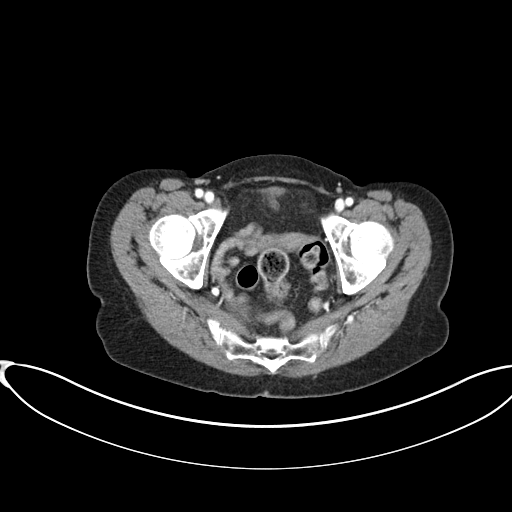
[im 29/79  soft-tissue]
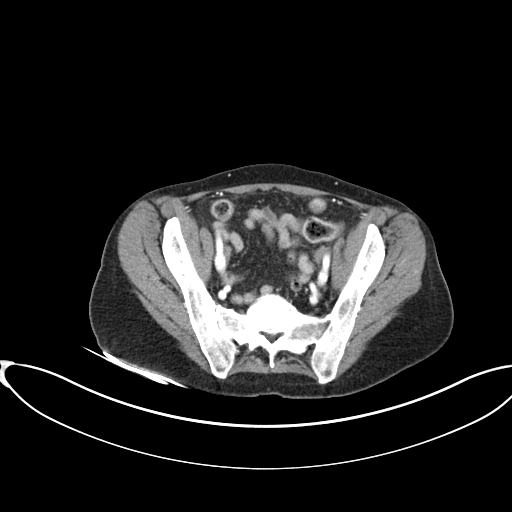
[im 32/79  soft-tissue]
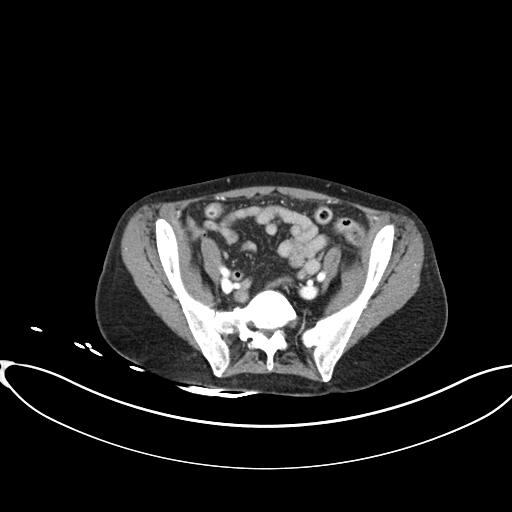
[im 40/79  soft-tissue]
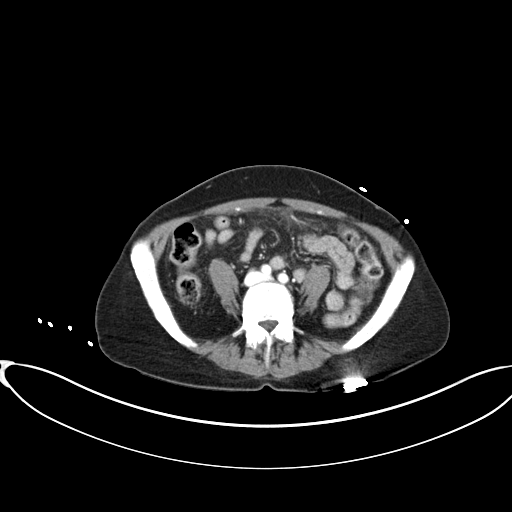
[im 47/79  soft-tissue]
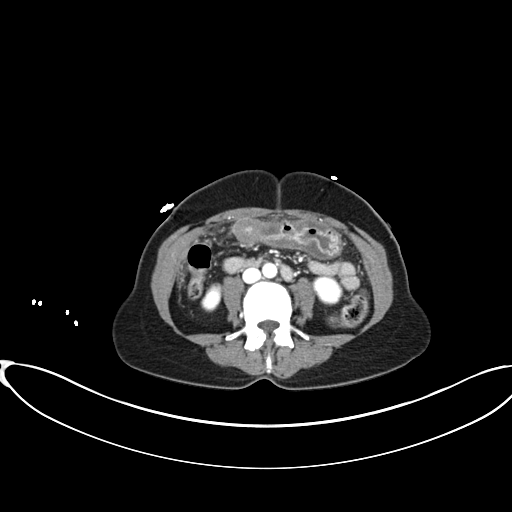
[im 50/79  soft-tissue]
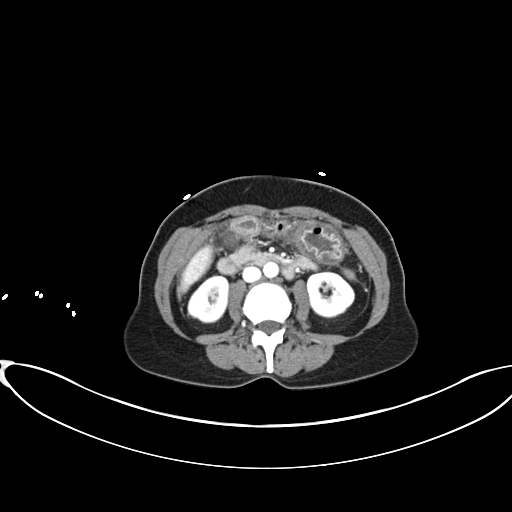
[im 50/79  bone]
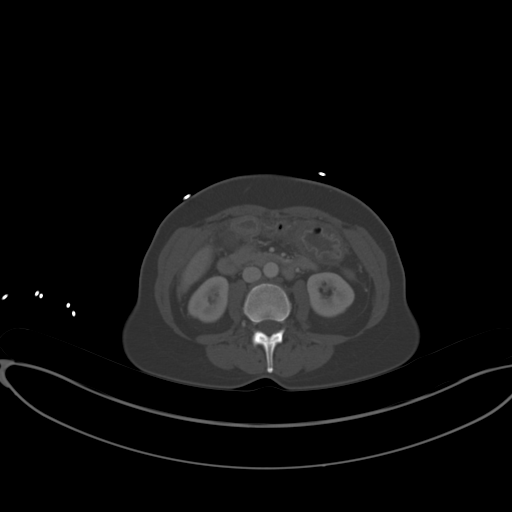
[im 57/79  soft-tissue]
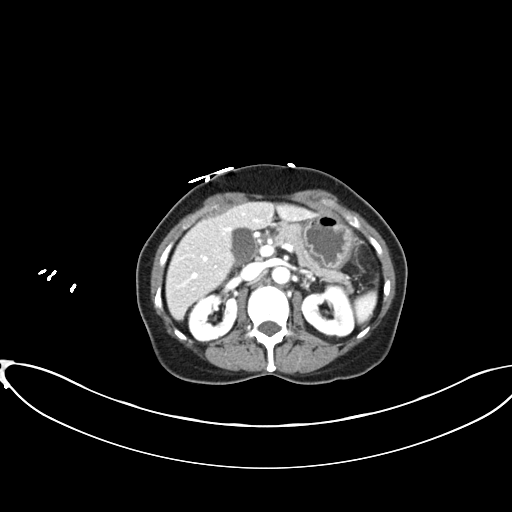
[im 61/79  soft-tissue]
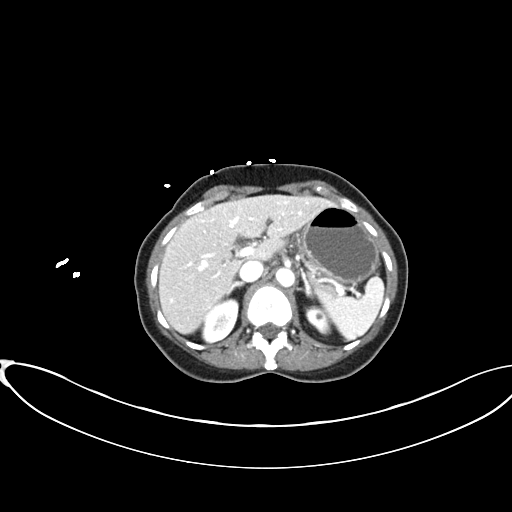
[im 68/79  soft-tissue]
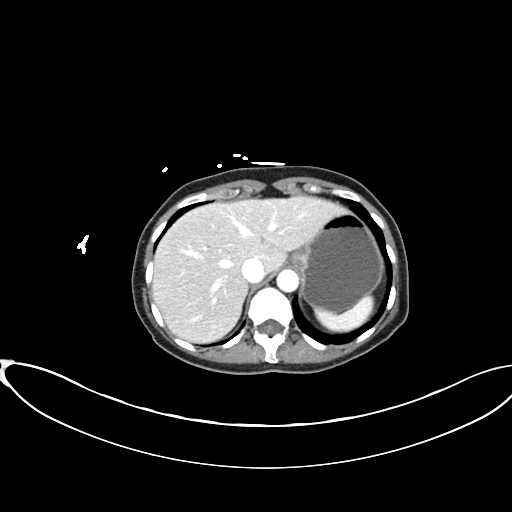
[im 75/79  soft-tissue]
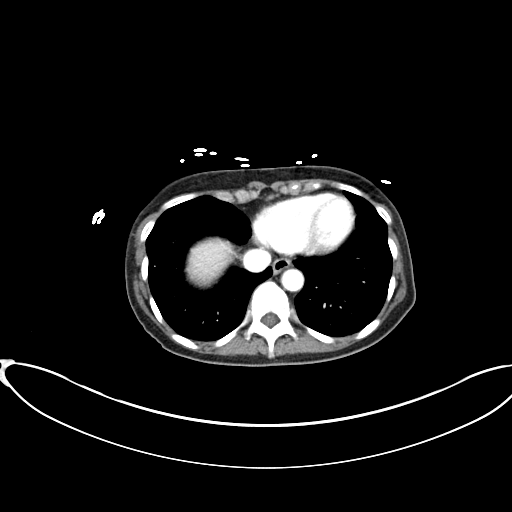

[Series 4: coronal st · coronal · 0.64mm/px · 3 of 115 slices shown]
[im 39/115  soft-tissue]
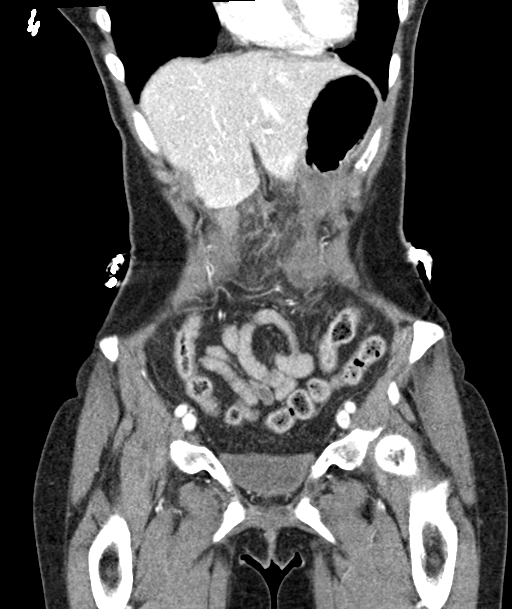
[im 51/115  soft-tissue]
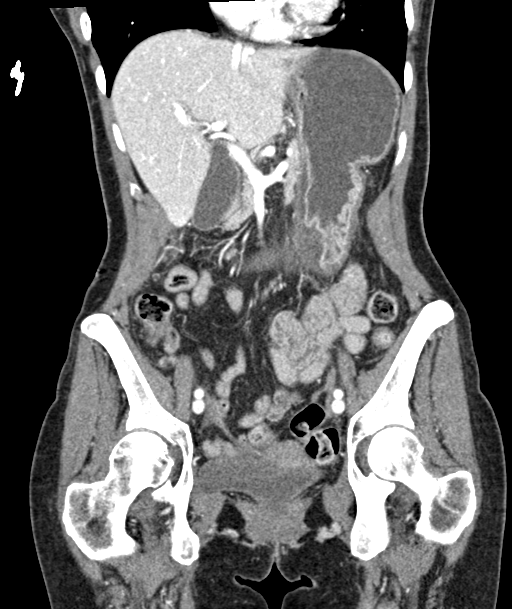
[im 64/115  soft-tissue]
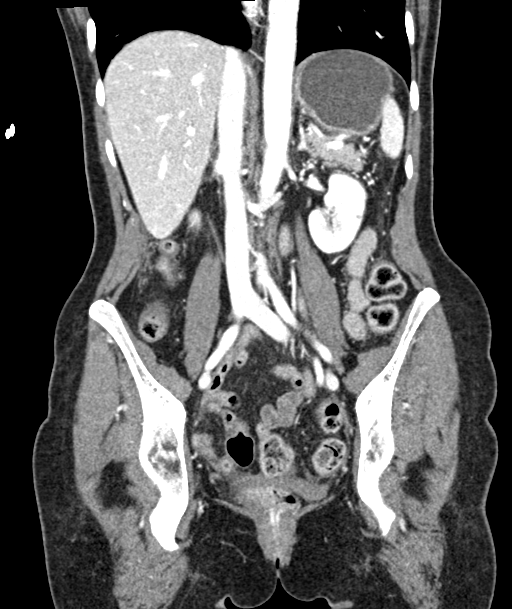

[16 of 46 positions shown; findings below may reference images not displayed]

RADIATION DOSE REDUCTION: This exam was performed according to the
departmental dose-optimization program which includes automated
exposure control, adjustment of the mA and/or kV according to
patient size and/or use of iterative reconstruction technique.

CONTRAST:  100mL OMNIPAQUE IOHEXOL 300 MG/ML  SOLN
FINDINGS: Lower chest: Clear lung bases. Normal heart size without pericardial
or pleural effusion. Mild pectus excavatum deformity.

Hepatobiliary: Normal liver. Normal gallbladder, without biliary
ductal dilatation.

Pancreas: Normal, without mass or ductal dilatation.

Spleen: Normal in size, without focal abnormality.

Adrenals/Urinary Tract: Normal adrenal glands. Bilateral too small
to characterize renal lesions which are well-circumscribed.
Interpolar right renal lesion measures 6 mm and greater than fluid
density on [DATE] and [DATE]. No hydronephrosis. Normal urinary bladder.

Stomach/Bowel: The gastric body and antrum are diffusely thick
walled with mucosal hyperenhancement, including on 34/2 and coronal
image 46. Gas tracks along the periphery of the superior aspect of
the gastric antrum including on 32/2 and 42/4. Normal colon,
appendix, and terminal ileum. Normal small bowel.

Vascular/Lymphatic: Aortic atherosclerosis. No abdominopelvic
adenopathy.

Reproductive: Normal uterus and adnexa.

Other: No significant free fluid.  Mild pelvic floor laxity.

Musculoskeletal: Degenerate disc disease at the lumbosacral
junction.
IMPRESSION: 1. Moderate gastric body and antral wall thickening with mucosal
hyperenhancement, consistent with gastritis. Gas tracking towards
the cephalad aspect of the gastric antrum could represent an ulcer
or even localized perforation. No free perforation identified.
2. No other explanation for abdominal pain.
3. Too small to characterize renal lesions. Interpolar right renal 6
mm lesion demonstrates complexity. Consider re-evaluation with pre
and post contrast abdominal MRI at 12 months.
4.  Aortic Atherosclerosis ([id number]-YWX.X).

## 2023-11-11 ENCOUNTER — Other Ambulatory Visit: Payer: Self-pay | Admitting: Family Medicine

## 2023-11-11 DIAGNOSIS — Z1231 Encounter for screening mammogram for malignant neoplasm of breast: Secondary | ICD-10-CM

## 2023-11-17 DIAGNOSIS — Z13220 Encounter for screening for lipoid disorders: Secondary | ICD-10-CM | POA: Diagnosis not present

## 2023-11-17 DIAGNOSIS — R0609 Other forms of dyspnea: Secondary | ICD-10-CM | POA: Diagnosis not present

## 2023-11-17 DIAGNOSIS — Z0000 Encounter for general adult medical examination without abnormal findings: Secondary | ICD-10-CM | POA: Diagnosis not present

## 2023-12-23 ENCOUNTER — Ambulatory Visit
Admission: RE | Admit: 2023-12-23 | Discharge: 2023-12-23 | Disposition: A | Discharge status: Home / Self Care | Source: Ambulatory Visit | Attending: Family Medicine

## 2023-12-23 DIAGNOSIS — Z1231 Encounter for screening mammogram for malignant neoplasm of breast: Secondary | ICD-10-CM

## 2024-02-17 ENCOUNTER — Other Ambulatory Visit (HOSPITAL_BASED_OUTPATIENT_CLINIC_OR_DEPARTMENT_OTHER): Payer: Self-pay | Admitting: Family Medicine

## 2024-02-17 DIAGNOSIS — R0602 Shortness of breath: Secondary | ICD-10-CM

## 2024-02-18 ENCOUNTER — Encounter (HOSPITAL_BASED_OUTPATIENT_CLINIC_OR_DEPARTMENT_OTHER): Payer: Self-pay

## 2024-02-20 ENCOUNTER — Ambulatory Visit (HOSPITAL_BASED_OUTPATIENT_CLINIC_OR_DEPARTMENT_OTHER)
Admission: RE | Admit: 2024-02-20 | Discharge: 2024-02-20 | Disposition: A | Discharge status: Home / Self Care | Source: Ambulatory Visit | Attending: Family Medicine

## 2024-02-20 DIAGNOSIS — R0602 Shortness of breath: Secondary | ICD-10-CM | POA: Diagnosis not present

## 2024-04-14 ENCOUNTER — Ambulatory Visit: Admitting: Pulmonary Disease

## 2024-04-14 VITALS — BP 119/61 | HR 55 | Ht 59.0 in | Wt 92.2 lb

## 2024-04-14 DIAGNOSIS — R0602 Shortness of breath: Secondary | ICD-10-CM | POA: Diagnosis not present

## 2024-04-14 NOTE — Patient Instructions (Signed)
 Schedule for pulmonary function test  Graded activities as tolerated  Follow-up in about 6 to 8 weeks

## 2024-04-14 NOTE — Progress Notes (Signed)
 Paula Ramos    [id number]    Nov 26, 1965  Primary Care Physician:Paula Ramos RAMAN, MD  Referring Physician: Chrystal Ramos RAMAN, MD [address] [place],  [place] [address]  Chief complaint:   Patient being seen for shortness of breath  HPI:  Shortness of breath with multiple activities  Recently used albuterol with no significant improvement  Never smoker No occupational predisposition  She does have a lot of nasal stuffiness and nasal drainage has used Flonase  and allergy pills with no significant benefits  She does have postnasal drip that is associated with a voice change and once in a while  She gets out of breath talking, out of breath walking short distances  No past history of asthma No personal history of smoking may have been exposed to some secondhand smoke worked in Engineering geologist, Photographer, worked on computers previously  [employer] but not heavy or around her  Social alcohol use  Outpatient Encounter Medications as of 04/14/2024  Medication Sig   albuterol (VENTOLIN HFA) 108 (90 Base) MCG/ACT inhaler 1 puff as needed Inhalation every 4 hrs   ferrous sulfate 325 (65 FE) MG tablet Take 325 mg by mouth daily with breakfast.   Multiple Vitamin (MULTI-VITAMIN DAILY PO) Take 1 tablet by mouth daily.   Probiotic Product (DIGESTIVE ADVANTAGE GUMMIES) CHEW Chew 1 tablet by mouth daily.   No facility-administered encounter medications on file as of 04/14/2024.    Allergies as of 04/14/2024   (No Known Allergies)    Past Medical History:  Diagnosis Date   Allergic rhinitis    Peptic ulcer disease     Past Surgical History:  Procedure Laterality Date   BIOPSY  02/11/2022   Procedure: BIOPSY;  Surgeon: Stacia Glendia BRAVO, MD;  Location: [facility];  Service: Gastroenterology;;   ESOPHAGOGASTRODUODENOSCOPY (EGD) WITH PROPOFOL  N/A 02/11/2022   Procedure: ESOPHAGOGASTRODUODENOSCOPY (EGD) WITH PROPOFOL ;  Surgeon: Stacia Glendia BRAVO, MD;  Location:  [facility];  Service: Gastroenterology;  Laterality: N/A;   NO PAST SURGERIES      Family History  Problem Relation Age of Onset   Kidney disease Mother    Colon cancer Neg Hx    Esophageal cancer Neg Hx    Rectal cancer Neg Hx    Stomach cancer Neg Hx     Social History   Socioeconomic History   Marital status: Widowed    Spouse name: Not on file   Number of children: 0   Years of education: Not on file   Highest education level: Not on file  Occupational History   Not on file  Tobacco Use   Smoking status: Never   Smokeless tobacco: Never  Vaping Use   Vaping status: Never Used  Substance and Sexual Activity   Alcohol use: Yes    Alcohol/week: 2.0 standard drinks of alcohol    Types: 2 Cans of beer per week   Drug use: No   Sexual activity: Not on file  Other Topics Concern   Not on file  Social History Narrative   Not on file   Social Drivers of Health   Financial Resource Strain: Not on file  Food Insecurity: Not on file  Transportation Needs: Not on file  Physical Activity: Not on file  Stress: Not on file  Social Connections: Unknown (01/20/2022)   Received from [facility]   Social Network    Social Network: Not on file  Intimate Partner Violence: Unknown (12/12/2021)   Received  from [facility]   HITS    Physically Hurt: Not on file    Insult or Talk Down To: Not on file    Threaten Physical Harm: Not on file    Scream or Curse: Not on file    Review of Systems  Respiratory:  Positive for shortness of breath.     Vitals:   04/14/24 1345  BP: 119/61  Pulse: (!) 55  SpO2: 98%     Physical Exam Constitutional:      Appearance: Normal appearance.  HENT:     Head: Normocephalic.     Right Ear: Tympanic membrane normal.     Mouth/Throat:     Mouth: Mucous membranes are moist.  Eyes:     General: No scleral icterus. Cardiovascular:     Rate and Rhythm: Normal rate and regular rhythm.     Heart sounds: No murmur heard.    No  friction rub.  Pulmonary:     Effort: No respiratory distress.     Breath sounds: No stridor. No wheezing or rhonchi.  Musculoskeletal:     Cervical back: No rigidity or tenderness.  Neurological:     Mental Status: She is alert.  Psychiatric:        Mood and Affect: Mood normal.    Data Reviewed: Recent sleep study showing mild obstructive sleep apnea  CT scan of the chest with no evidence of interstitial process  Chest x-ray with slightly hyperinflated lung fields   Assessment/Plan: Shortness of breath  Postnasal drip  Contributors to her shortness of breath is unclear at the present time  Will benefit from a pulmonary function test  Graded activities as tolerated  Modifications to help nasal congestion and postnasal drip should be put in place  Not initiating any other inhaler at present  Follow-up in about 6 weeks  Mild obstructive sleep apnea is unlikely to be contributing to current symptomatology of shortness of breath  Jennet Epley MD [facility] 04/14/2024, 2:12 PM  CC: Paula Ramos, *

## 2024-06-27 ENCOUNTER — Encounter: Payer: Self-pay | Admitting: Pulmonary Disease

## 2024-06-27 ENCOUNTER — Ambulatory Visit: Admitting: Pulmonary Disease

## 2024-06-27 ENCOUNTER — Ambulatory Visit (INDEPENDENT_AMBULATORY_CARE_PROVIDER_SITE_OTHER)

## 2024-06-27 VITALS — BP 120/62 | HR 70 | Temp 98.4°F | Ht 59.5 in | Wt 94.6 lb

## 2024-06-27 DIAGNOSIS — R942 Abnormal results of pulmonary function studies: Secondary | ICD-10-CM | POA: Diagnosis not present

## 2024-06-27 DIAGNOSIS — R0602 Shortness of breath: Secondary | ICD-10-CM

## 2024-06-27 LAB — PULMONARY FUNCTION TEST
DL/VA % pred: 91 %
DL/VA: 4 ml/min/mmHg/L
DLCO unc % pred: 72 %
DLCO unc: 12.53 ml/min/mmHg
FEF 25-75 Post: 2.21 L/s
FEF 25-75 Pre: 1.53 L/s
FEF2575-%Change-Post: 44 %
FEF2575-%Pred-Post: 105 %
FEF2575-%Pred-Pre: 72 %
FEV1-%Change-Post: 8 %
FEV1-%Pred-Post: 82 %
FEV1-%Pred-Pre: 76 %
FEV1-Post: 1.78 L
FEV1-Pre: 1.65 L
FEV1FVC-%Change-Post: 3 %
FEV1FVC-%Pred-Pre: 103 %
FEV6-%Change-Post: 4 %
FEV6-%Pred-Post: 79 %
FEV6-%Pred-Pre: 76 %
FEV6-Post: 2.13 L
FEV6-Pre: 2.04 L
FEV6FVC-%Pred-Post: 103 %
FEV6FVC-%Pred-Pre: 103 %
FVC-%Change-Post: 4 %
FVC-%Pred-Post: 76 %
FVC-%Pred-Pre: 73 %
FVC-Post: 2.13 L
FVC-Pre: 2.04 L
Post FEV1/FVC ratio: 83 %
Post FEV6/FVC ratio: 100 %
Pre FEV1/FVC ratio: 81 %
Pre FEV6/FVC Ratio: 100 %

## 2024-06-27 MED ORDER — FLUTICASONE FUROATE-VILANTEROL 100-25 MCG/ACT IN AEPB
1.0000 | INHALATION_SPRAY | Freq: Every day | RESPIRATORY_TRACT | 5 refills | Status: AC
Start: 2024-06-27 — End: ?

## 2024-06-27 NOTE — Patient Instructions (Signed)
 Full pft performed today except pleth. Pleth not performed due to claustrophobia.

## 2024-06-27 NOTE — Progress Notes (Signed)
 Full pft performed today except pleth. Pleth not performed due to claustrophobia.

## 2024-06-27 NOTE — Patient Instructions (Addendum)
 I will see you back in about 3 months  I will put in for an inhaler to be used once a day called Breo -Make sure you rinse your mouth after use  Use albuterol as needed, can be used up to 4 times a day if you are feeling short of breath  Graded activities as tolerated  Call us  with significant concerns

## 2024-06-27 NOTE — Progress Notes (Signed)
 Paula Ramos    [id number]    [birth year]  Primary Care Physician:Timberlake, Lamarr RAMAN, MD  Referring Physician: Chrystal Lamarr RAMAN, MD [address] [facility] [address] [place],  [place]  Chief complaint:   Patient being seen for shortness of breath  HPI:  Shortness of breath with multiple activities  Recently used albuterol with no significant improvement  Never smoker No occupational predisposition  She does have a lot of nasal stuffiness and nasal drainage has used Flonase  and allergy pills with no significant benefits  She does have postnasal drip that is associated with a voice change and once in a while  She has been feeling about the same Breathing is not any worse not any better but still notices shortness of breath with activity  Short of breath walking short distances or short of breath with talking  Has no past history of asthma  Exposure to some secondhand smoke  She has worked in Engineering geologist, 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   Multiple Vitamin (MULTI-VITAMIN DAILY PO) Take 1 tablet by mouth daily.   Probiotic Product (DIGESTIVE ADVANTAGE GUMMIES) CHEW Chew 1 tablet by mouth daily.   No facility-administered encounter medications on file as of 06/27/2024.    Allergies as of 06/27/2024   (No Known Allergies)    Past Medical History:  Diagnosis Date   Allergic rhinitis    Peptic ulcer disease     Past Surgical History:  Procedure Laterality Date   BIOPSY  02/11/2022   Procedure: BIOPSY;  Surgeon: Stacia Glendia BRAVO, MD;  Location: [facility];  Service: Gastroenterology;;   ESOPHAGOGASTRODUODENOSCOPY (EGD) WITH PROPOFOL  N/A 02/11/2022   Procedure:  ESOPHAGOGASTRODUODENOSCOPY (EGD) WITH PROPOFOL ;  Surgeon: Stacia Glendia BRAVO, MD;  Location: [facility];  Service: Gastroenterology;  Laterality: N/A;   NO PAST SURGERIES      Family History  Problem Relation Age of Onset   Kidney disease Mother    Colon cancer Neg Hx    Esophageal cancer Neg Hx    Rectal cancer Neg Hx    Stomach cancer Neg Hx     Social History   Socioeconomic History   Marital status: Widowed    Spouse name: Not on file   Number of children: 0   Years of education: Not on file   Highest education level: Not on file  Occupational History   Not on file  Tobacco Use   Smoking status: Never   Smokeless tobacco: Never  Vaping Use   Vaping status: Never Used  Substance and Sexual Activity   Alcohol use: Yes    Alcohol/week: 2.0 standard drinks of alcohol    Types: 2 Cans of beer per week   Drug use: No   Sexual activity: Not on file  Other Topics Concern   Not on file  Social History Narrative   Not on file   Social Drivers of Health   Financial Resource Strain: Not on file  Food Insecurity: Not on file  Transportation Needs: Not on file  Physical Activity: Not on file  Stress: Not on file  Social Connections: Unknown (01/20/2022)   Received from [facility]   Social Network  Social Network: Not on file  Intimate Partner Violence: Unknown (12/12/2021)   Received from [facility]   HITS    Physically Hurt: Not on file    Insult or Talk Down To: Not on file    Threaten Physical Harm: Not on file    Scream or Curse: Not on file    Review of Systems  Respiratory:  Positive for shortness of breath.     Vitals:   06/27/24 1533  BP: 120/62  Pulse: 70  Temp: 98.4 F (36.9 C)  SpO2: 94%     Physical Exam Constitutional:      Appearance: Normal appearance.  HENT:     Head: Normocephalic.     Right Ear: Tympanic membrane normal.     Mouth/Throat:     Mouth: Mucous membranes are moist.  Eyes:     General: No scleral  icterus. Cardiovascular:     Rate and Rhythm: Normal rate and regular rhythm.     Heart sounds: No murmur heard.    No friction rub.  Pulmonary:     Effort: No respiratory distress.     Breath sounds: No stridor. No wheezing or rhonchi.  Musculoskeletal:     Cervical back: No rigidity or tenderness.  Neurological:     Mental Status: She is alert.  Psychiatric:        Mood and Affect: Mood normal.    Data Reviewed: Recent sleep study showing mild obstructive sleep apnea  CT scan of the chest with no evidence of interstitial process  Chest x-ray with slightly hyperinflated lung fields   Pulmonary function test reviewed with the patient today showing no obstruction, no significant bronchodilator response There is significant response in the FEF 25-75 with significant bronchodilator response which suggests possible small airway disease with significant reversibility  Assessment/Plan: Shortness of breath  Small airway obstruction  Chest discomfort  Continue graded activities as tolerated  Will start her on Breo 100  Can use albuterol as needed  Symptoms are likely to be related to mild obstructive sleep apnea that was previously diagnosed  Follow-up in about 3 months  Encouraged to call with significant concerns  I spent 32 minutes reviewing records, interviewing/examining patient, and managing orders.  Jennet Epley MD [facility] 06/27/2024, 3:43 PM  CC: Chrystal Lamarr RAMAN, *

## 2024-09-27 ENCOUNTER — Ambulatory Visit: Admitting: Pulmonary Disease
